# Patient Record
Sex: Male | Born: 1965
Health system: Southern US, Community
[De-identification: ages and names within clinical notes are randomized; demographics above are authoritative.]

## PROBLEM LIST (undated history)

## (undated) DIAGNOSIS — G4733 Obstructive sleep apnea (adult) (pediatric): Secondary | ICD-10-CM

## (undated) DIAGNOSIS — E119 Type 2 diabetes mellitus without complications: Secondary | ICD-10-CM

## (undated) DIAGNOSIS — Z9289 Personal history of other medical treatment: Secondary | ICD-10-CM

## (undated) DIAGNOSIS — B1920 Unspecified viral hepatitis C without hepatic coma: Secondary | ICD-10-CM

## (undated) DIAGNOSIS — I2510 Atherosclerotic heart disease of native coronary artery without angina pectoris: Secondary | ICD-10-CM

## (undated) DIAGNOSIS — I219 Acute myocardial infarction, unspecified: Secondary | ICD-10-CM

## (undated) DIAGNOSIS — E669 Obesity, unspecified: Secondary | ICD-10-CM

## (undated) HISTORY — DX: Obesity, unspecified: E66.9

## (undated) HISTORY — DX: Obstructive sleep apnea (adult) (pediatric): G47.33

## (undated) HISTORY — DX: Unspecified viral hepatitis C without hepatic coma: B19.20

## (undated) HISTORY — DX: Personal history of other medical treatment: Z92.89

## (undated) HISTORY — PX: KNEE SURGERY: SHX244

## (undated) HISTORY — DX: Atherosclerotic heart disease of native coronary artery without angina pectoris: I25.10

## (undated) HISTORY — PX: HERNIA REPAIR: SHX51

## (undated) HISTORY — PX: CERVICAL FUSION: SHX112

## (undated) HISTORY — DX: Type 2 diabetes mellitus without complications: E11.9

---

## 1999-03-04 ENCOUNTER — Emergency Department (HOSPITAL_COMMUNITY): Admission: EM | Admit: 1999-03-04 | Discharge: 1999-03-04 | Payer: Self-pay | Admitting: Emergency Medicine

## 2000-08-31 DIAGNOSIS — B1920 Unspecified viral hepatitis C without hepatic coma: Secondary | ICD-10-CM

## 2000-08-31 HISTORY — DX: Unspecified viral hepatitis C without hepatic coma: B19.20

## 2000-09-14 ENCOUNTER — Emergency Department (HOSPITAL_COMMUNITY): Admission: EM | Admit: 2000-09-14 | Discharge: 2000-09-14 | Payer: Self-pay | Admitting: Emergency Medicine

## 2006-06-09 ENCOUNTER — Encounter: Admission: RE | Admit: 2006-06-09 | Discharge: 2006-06-09 | Payer: Self-pay | Admitting: Internal Medicine

## 2007-09-09 ENCOUNTER — Encounter: Admission: RE | Admit: 2007-09-09 | Discharge: 2007-09-09 | Payer: Self-pay | Admitting: Occupational Medicine

## 2007-11-24 ENCOUNTER — Ambulatory Visit (HOSPITAL_COMMUNITY): Admission: RE | Admit: 2007-11-24 | Discharge: 2007-11-24 | Payer: Self-pay | Admitting: Orthopedic Surgery

## 2008-08-31 DIAGNOSIS — I219 Acute myocardial infarction, unspecified: Secondary | ICD-10-CM

## 2008-08-31 DIAGNOSIS — I2510 Atherosclerotic heart disease of native coronary artery without angina pectoris: Secondary | ICD-10-CM

## 2008-08-31 HISTORY — PX: CORONARY ANGIOPLASTY WITH STENT PLACEMENT: SHX49

## 2008-08-31 HISTORY — DX: Acute myocardial infarction, unspecified: I21.9

## 2008-08-31 HISTORY — DX: Atherosclerotic heart disease of native coronary artery without angina pectoris: I25.10

## 2008-09-10 ENCOUNTER — Inpatient Hospital Stay (HOSPITAL_COMMUNITY): Admission: AD | Admit: 2008-09-10 | Discharge: 2008-09-13 | Payer: Self-pay | Admitting: Cardiology

## 2008-09-11 ENCOUNTER — Encounter: Admission: RE | Admit: 2008-09-11 | Discharge: 2008-09-12 | Payer: Self-pay | Admitting: Cardiology

## 2008-10-08 ENCOUNTER — Ambulatory Visit: Payer: Self-pay | Admitting: Cardiology

## 2008-10-08 ENCOUNTER — Ambulatory Visit: Payer: Self-pay | Admitting: Cardiovascular Disease

## 2008-10-08 ENCOUNTER — Ambulatory Visit (HOSPITAL_COMMUNITY): Admission: RE | Admit: 2008-10-08 | Discharge: 2008-10-08 | Payer: Self-pay | Admitting: Cardiology

## 2008-10-11 ENCOUNTER — Encounter (HOSPITAL_COMMUNITY): Admission: RE | Admit: 2008-10-11 | Discharge: 2008-10-29 | Payer: Self-pay | Admitting: Cardiology

## 2010-01-18 IMAGING — CR DG CHEST 1V PORT
1 series · 1 of 1 positions shown · non-contrast
Comparison: 11/18/2007

CLINICAL DATA: Chest pain and

PORTABLE CHEST - 1 VIEW

[view not recorded]
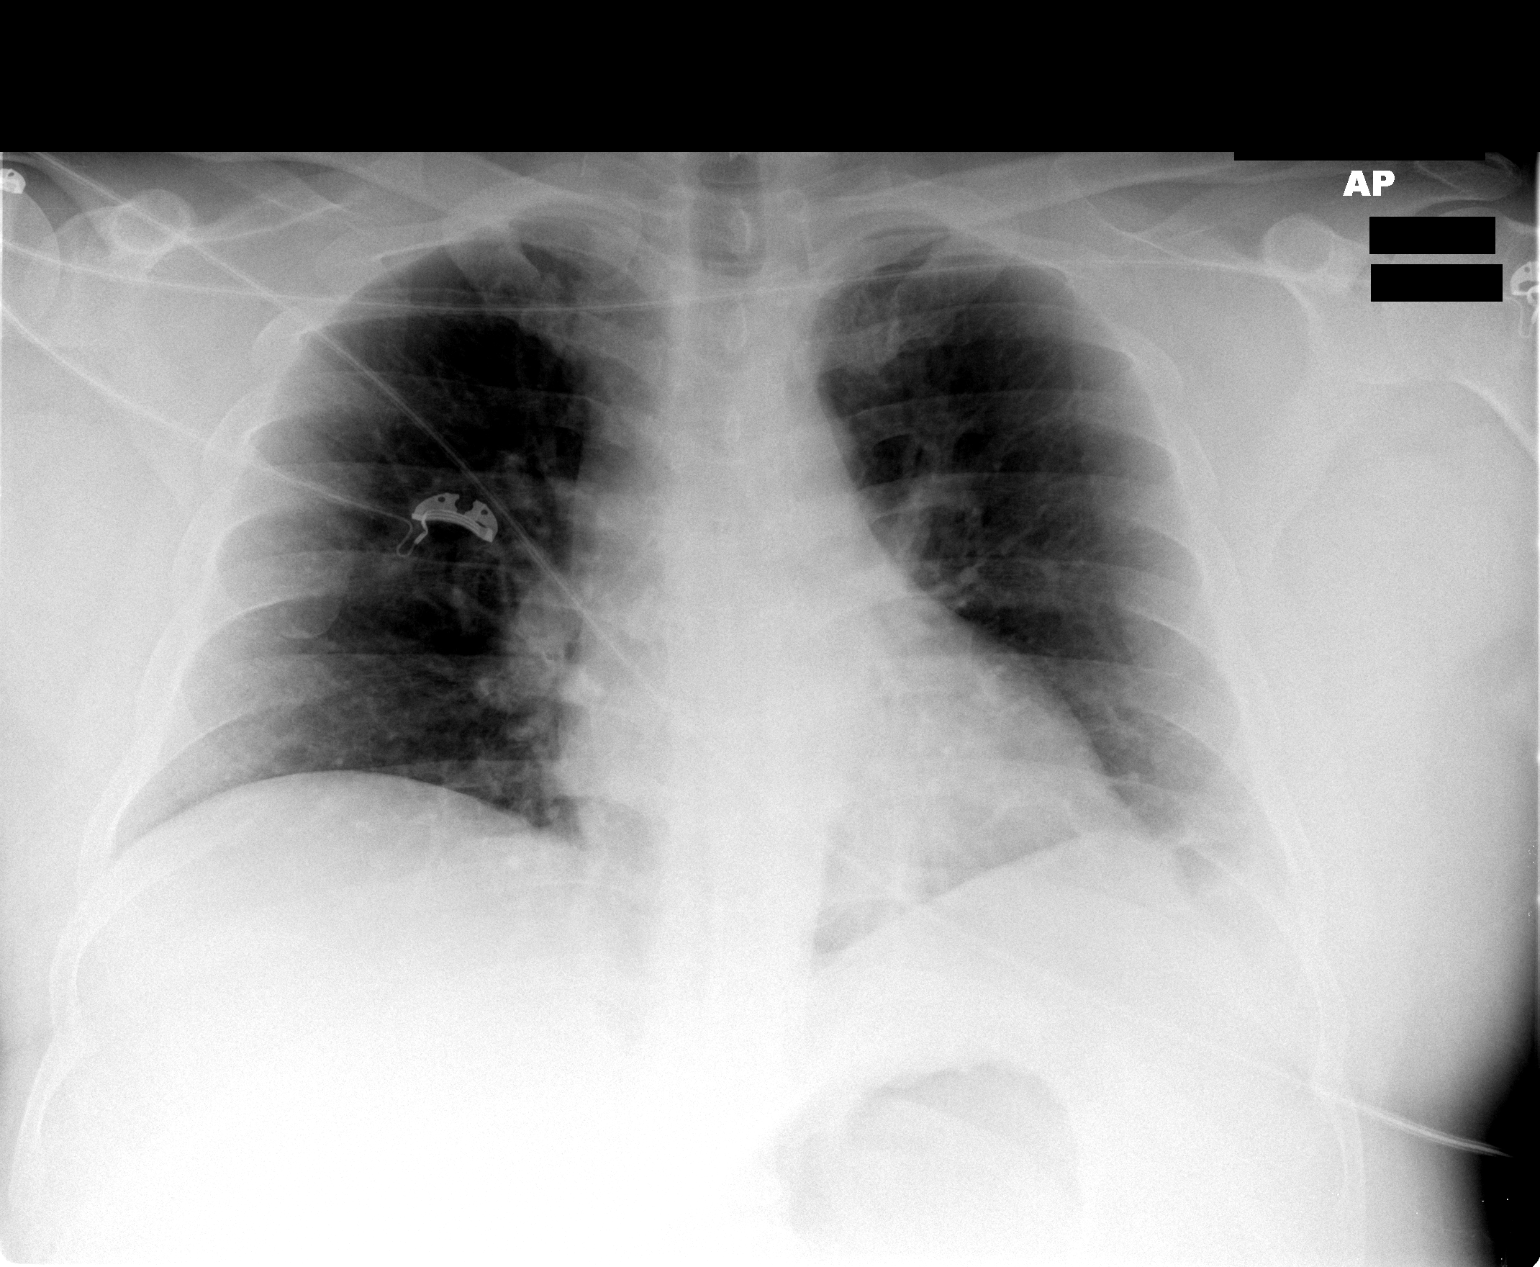

[1 of 1 positions shown; findings below may reference images not displayed]

FINDINGS: The heart size is normal. No pleural effusion is noted.

There is atelectasis at the left lung base.

No airspace densities are present.
IMPRESSION: 1.  Left base atelectasis.

## 2010-12-15 LAB — BASIC METABOLIC PANEL
BUN: 14 mg/dL (ref 6–23)
BUN: 17 mg/dL (ref 6–23)
BUN: 9 mg/dL (ref 6–23)
CO2: 24 mEq/L (ref 19–32)
CO2: 25 mEq/L (ref 19–32)
CO2: 27 mEq/L (ref 19–32)
Calcium: 8.5 mg/dL (ref 8.4–10.5)
Calcium: 9 mg/dL (ref 8.4–10.5)
Calcium: 9.2 mg/dL (ref 8.4–10.5)
Chloride: 103 mEq/L (ref 96–112)
Chloride: 105 mEq/L (ref 96–112)
Chloride: 106 mEq/L (ref 96–112)
Creatinine, Ser: 0.91 mg/dL (ref 0.4–1.5)
Creatinine, Ser: 1.03 mg/dL (ref 0.4–1.5)
Creatinine, Ser: 1.15 mg/dL (ref 0.4–1.5)
GFR calc Af Amer: >60 mL/min (ref >60)
GFR calc Af Amer: >60 mL/min (ref >60)
GFR calc Af Amer: >60 mL/min (ref >60)
GFR calc non Af Amer: >60 mL/min (ref >60)
GFR calc non Af Amer: >60 mL/min (ref >60)
GFR calc non Af Amer: >60 mL/min (ref >60)
Glucose, Bld: 117 mg/dL — ABNORMAL HIGH (ref 70–99)
Glucose, Bld: 121 mg/dL — ABNORMAL HIGH (ref 70–99)
Glucose, Bld: 130 mg/dL — ABNORMAL HIGH (ref 70–99)
Potassium: 3.7 mEq/L (ref 3.5–5.1)
Potassium: 3.8 mEq/L (ref 3.5–5.1)
Potassium: 4.1 mEq/L (ref 3.5–5.1)
Sodium: 134 mEq/L — ABNORMAL LOW (ref 135–145)
Sodium: 136 mEq/L (ref 135–145)
Sodium: 140 mEq/L (ref 135–145)

## 2010-12-15 LAB — CBC
HCT: 42.6 % (ref 39.0–52.0)
HCT: 42.8 % (ref 39.0–52.0)
HCT: 43.4 % (ref 39.0–52.0)
Hemoglobin: 14.7 g/dL (ref 13.0–17.0)
Hemoglobin: 14.8 g/dL (ref 13.0–17.0)
Hemoglobin: 14.8 g/dL (ref 13.0–17.0)
MCHC: 34 g/dL (ref 30.0–36.0)
MCHC: 34.6 g/dL (ref 30.0–36.0)
MCHC: 34.6 g/dL (ref 30.0–36.0)
MCV: 90.4 fL (ref 78.0–100.0)
MCV: 90.7 fL (ref 78.0–100.0)
MCV: 91.7 fL (ref 78.0–100.0)
Platelets: 191 10*3/uL (ref 150–400)
Platelets: 210 10*3/uL (ref 150–400)
Platelets: 215 10*3/uL (ref 150–400)
RBC: 4.71 MIL/uL (ref 4.22–5.81)
RBC: 4.72 MIL/uL (ref 4.22–5.81)
RBC: 4.73 MIL/uL (ref 4.22–5.81)
RDW: 13.3 % (ref 11.5–15.5)
RDW: 13.3 % (ref 11.5–15.5)
RDW: 13.6 % (ref 11.5–15.5)
WBC: 12.2 10*3/uL — ABNORMAL HIGH (ref 4.0–10.5)
WBC: 12.5 10*3/uL — ABNORMAL HIGH (ref 4.0–10.5)
WBC: 13 10*3/uL — ABNORMAL HIGH (ref 4.0–10.5)

## 2010-12-15 LAB — CARDIAC PANEL(CRET KIN+CKTOT+MB+TROPI)
CK, MB: 165.6 ng/mL — ABNORMAL HIGH (ref 0.3–4.0)
CK, MB: 20.5 ng/mL — ABNORMAL HIGH (ref 0.3–4.0)
CK, MB: 58.7 ng/mL — ABNORMAL HIGH (ref 0.3–4.0)
CK, MB: 80.1 ng/mL — ABNORMAL HIGH (ref 0.3–4.0)
Relative Index: 10.4 — ABNORMAL HIGH (ref 0.0–2.5)
Relative Index: 5.1 — ABNORMAL HIGH (ref 0.0–2.5)
Relative Index: 7.4 — ABNORMAL HIGH (ref 0.0–2.5)
Relative Index: 8 — ABNORMAL HIGH (ref 0.0–2.5)
Total CK: 1004 U/L — ABNORMAL HIGH (ref 7–232)
Total CK: 1596 U/L — ABNORMAL HIGH (ref 7–232)
Total CK: 401 U/L — ABNORMAL HIGH (ref 7–232)
Total CK: 794 U/L — ABNORMAL HIGH (ref 7–232)
Troponin I: 13.8 ng/mL (ref 0.00–0.06)
Troponin I: 14.92 ng/mL (ref 0.00–0.06)
Troponin I: 18.56 ng/mL (ref 0.00–0.06)
Troponin I: 37.63 ng/mL (ref 0.00–0.06)

## 2010-12-15 LAB — URINALYSIS, MICROSCOPIC ONLY
Bilirubin Urine: NEGATIVE
Glucose, UA: NEGATIVE mg/dL
Hgb urine dipstick: NEGATIVE
Ketones, ur: NEGATIVE mg/dL
Leukocytes, UA: NEGATIVE
Nitrite: NEGATIVE
Protein, ur: NEGATIVE mg/dL
Specific Gravity, Urine: 1.013 (ref 1.005–1.030)
Urobilinogen, UA: 1 mg/dL (ref 0.0–1.0)
pH: 7 (ref 5.0–8.0)

## 2010-12-15 LAB — HEPATIC FUNCTION PANEL
ALT: 51 U/L (ref 0–53)
AST: 84 U/L — ABNORMAL HIGH (ref 0–37)
Albumin: 3.3 g/dL — ABNORMAL LOW (ref 3.5–5.2)
Alkaline Phosphatase: 63 U/L (ref 39–117)
Bilirubin, Direct: 0.1 mg/dL (ref 0.0–0.3)
Indirect Bilirubin: 1 mg/dL — ABNORMAL HIGH (ref 0.3–0.9)
Total Bilirubin: 1.1 mg/dL (ref 0.3–1.2)
Total Protein: 6.5 g/dL (ref 6.0–8.3)

## 2010-12-15 LAB — LIPID PANEL
Cholesterol: 211 mg/dL — ABNORMAL HIGH (ref 0–200)
HDL: 30 mg/dL — ABNORMAL LOW (ref >39)
LDL Cholesterol: 145 mg/dL — ABNORMAL HIGH (ref 0–99)
Total CHOL/HDL Ratio: 7 RATIO
Triglycerides: 181 mg/dL — ABNORMAL HIGH (ref <150)
VLDL: 36 mg/dL (ref 0–40)

## 2010-12-15 LAB — MAGNESIUM: Magnesium: 2.4 mg/dL (ref 1.5–2.5)

## 2010-12-15 LAB — TSH: TSH: 3.15 u[IU]/mL (ref 0.350–4.500)

## 2010-12-15 LAB — PROTIME-INR
INR: 1.1 (ref 0.00–1.49)
Prothrombin Time: 14.1 seconds (ref 11.6–15.2)

## 2010-12-15 LAB — APTT: aPTT: 29 seconds (ref 24–37)

## 2011-01-13 NOTE — Discharge Summary (Signed)
[name], BEBO NO.:  [id number]   MEDICAL RECORD NO.:  [id number]          PATIENT TYPE:  INP   LOCATION:  2012                         FACILITY:  [facility]   PHYSICIAN:  Cristy Hilts. Jacinto Halim, MD       DATE OF BIRTH:  [birth year]   DATE OF ADMISSION:  09/10/2008  DATE OF DISCHARGE:  09/13/2008                               DISCHARGE SUMMARY   DISCHARGE DIAGNOSES:  1. Anterior-ST-elevation myocardial infarction this admission, treated      with left anterior descending Xience stenting on September 10, 2008.  2. Moderate left ventricular dysfunction with an ejection fraction of      40-45% at catheterization.  3. History of hepatitis C, treated with interferon and ribavirin      successfully 7 years ago.  4. Dyslipidemia, discharged on statin.  5. History of smoking.   HOSPITAL COURSE:  The patient is a pleasant 45 year old male with no  prior history of coronary artery disease.  He works as a Fish farm manager.  He developed hepatitis C as an occupational exposure  approximately 7 years ago, but was treated successfully with interferon  and ribavirin.  He presented on September 13, 2008, with sudden substernal  chest pain.  He actually tried to drive himself to the hospital, but  felt he was unable to make it and pulled into a hemodialysis center and  had them called EMS.  On arrival, he was taken emergently to the Cath  Lab for acute ST-elevation anterior MI.  In the Cath Lab, he was  enrolled in the Infuse study.  He underwent intervention by Dr. Jacinto Halim  with an LAD Xience stent placed.  There was no other significant  coronary artery disease, he did have a minor narrowing of 30% in the  RCA.  EF was 40-45%.  The patient was transferred to CCU.  He tolerated  the procedure well.  CKs peaked at about 1000.  He was transferred to  telemetry and mobilized.  The patient does have a smoking history, but  says he quit 2 weeks ago.  He was seen by Cardiac Rehab.   Apparently,  the patient has a history of sleep apnea and was placed on CPAP that he  takes at home.  Lipid panel revealed an LDL of 145 and an HDL of 30.  He  was placed on Crestor 20 mg a day.  He does have a slightly elevated  SGOT at 84 with a normal SGPT, this will need to be followed closely as  an outpatient with his history of hepatitis, but we suspect this  elevation is from his MI.  Dr. Jacinto Halim feels he can be discharged on  September 13, 2008.   DISCHARGE MEDICATIONS:  1. Coated aspirin 325 mg a day.  2. Effient 10 mg a day.  3. Crestor 20 mg a day.  4. Inspra 25 mg a day.  5. Coreg 3.125 mg twice a day.  6. Altace 5 mg daily.  7. Nitroglycerin 0.4 mg sublingual p.r.n.   DISCHARGE LABORATORY DATA:  White count 13, hemoglobin  14.8, hematocrit  42.8, and platelets 210.  Sodium 140, potassium 4.1, BUN 17, and  creatinine 1.15.  CK peaked at 1004, LDL 145, HDL 30, SGOT 84, and SGPT  51.  EKG shows a sinus rhythm with anterior ST elevation and EKG changes  consistent with post-anterior MI.  Chest x-ray shows left basilar  atelectasis.   DISPOSITION:  The patient is discharged in stable condition.  He will  follow up with Dr. Jacinto Halim as an outpatient.  He will need close  monitoring of his LFTs.  He is instructed to stay out of work until he  is cleared to return by Dr. Jacinto Halim.      Abelino Derrick, P.A.      Cristy Hilts. Jacinto Halim, MD  Electronically Signed    LKK/MEDQ  D:  09/13/2008  T:  09/14/2008  Job:  [id number]   cc:   Cristy Hilts. Jacinto Halim, MD

## 2011-01-13 NOTE — Op Note (Signed)
[name], [name]                ACCOUNT NO.:  [id number]   MEDICAL RECORD NO.:  [id number]          PATIENT TYPE:  AMB   LOCATION:  DAY                          FACILITY:  [facility]   PHYSICIAN:  Madlyn Frankel. Charlann Boxer, M.D.  DATE OF BIRTH:  30-Jul-1966   DATE OF PROCEDURE:  11/24/2007  DATE OF DISCHARGE:                               OPERATIVE REPORT   PREOPERATIVE DIAGNOSIS:  Right knee chondral injury in acute setting  from work place injury.   POSTOPERATIVE DIAGNOSIS/FINDINGS:  1. Chondral injury or at least orthopedic type defect to the medial      condyle on the distal to posterior portion, the midportion of      medial femoral condyle.  2. Degenerative nature to the posterior horn medial meniscus.  3. Synovitis.  4. A 5 to 7 mm area over the lateral apex of the patella with a      chondral defect as well.   PROCEDURE:  1. Right knee diagnostic and operative arthroscopy with medial and      patellofemoral chondroplasty with debridement of loose flaps of      cartilage back to a stable level and then microfracture carried      out.  2. Partial medial meniscectomy involving the posterior horn, most      likely is a result of the cartilage flap and defect present.  3. Synovectomy.   SURGEON:  Charlann Boxer.   ASSISTANT:  None.   ANESTHESIA:  General LMA plus locally applied pre and post procedure  anesthetic.   COMPLICATION:  None.   DRAINS:  None.   INDICATION FOR PROCEDURE:  Amen is a 45 year old male who presented to  the office for Worker's Comp evaluation of his right knee after an  injury.  An MRI had indicated chondral injury to the medial femoral  condyle.  Given the persistence of mechanical symptoms despite attempts  at conservative measures surgery was discussed and opted for.  He was  approved for surgery, consent was obtained.   PROCEDURE IN DETAIL:  The patient was prepped in the operative theater  with adequate anesthesia.  Preoperative antibiotics, Ancef,  administered.  The patient was positioned supine with the right leg  placed in a leg holder.  With the right legplaced in leg holder, the  right lower extremity was prepped and draped in a sterile fashion.  Standard inferomedial, superomedial, inferior lateral portals were  utilized.  Diagnostic evaluation of the knee revealed the above finding  and what was initially noted was a significant synovitic response  anteriorly, disrupting some of the visualization of the patellofemoral  compartment.   Medially, through the inferior medial portal, the chondral injury and  defect were noted on the medial femoral condyle distal weightbearing and  posterior weightbearing surfaces.  There was an associated degenerative  macerated central portion of the posterior horn of medial meniscus that  was penetrable via probe.  I used a biting basket and 3-5 shaver and  removed just about 5-10% of his meniscus.  The remaining meniscus did  not have any sign of instability.  The 3-5  shaver was then utilized for  significant debridement of medial femoral condyle flaps.  There was  never any areas of exposed bone.  I did have to bring the knee into a  pretty good amount of flexion to get to the posterior aspect oto remove  these unstable flaps.  The ACL was noted to be intact, the lateral  compartment looked very good.   Please note that within the medial component, at the time of the  operation, there was a fairly large free floating piece of cartilage  presenting away from the medial femoral condyle based on the size and  the appearance of the condyle.  Anteriorly, the 3-4-5 shaver was  utilized to debride the apex lesion.  Synovectomy was carried out to  help expose well the potential trochlear defect as well as debride the  medial aspect of the knee, as the patient had significant complaints of  anterior knee discomfort.  At this point though the etiology of the knee  discomfort would be a synovitic  response from his knee vs. this small  area of patellofemoral change.   Following this, the knee was re-examined to make sure there was no loose  fragments of cartilage given the debridements that were carried out.  The instrumentation was removed and the portal sites reapproximated  using a #4-0 nylon.  I then injected the knee with 40 mL of 0.25%  Marcaine with epinephrine.  Note, pre-incision I injected the portal  sites with 1% with a total of 30 mL.  The knee was then dressed into a  sterile bulky dry dressing, he was then brought to the recovery room  extubated in stable condition, tolerating the procedure well.      Madlyn Frankel Charlann Boxer, M.D.  Electronically Signed     MDO/MEDQ  D:  11/24/2007  T:  11/24/2007  Job:  [id number]

## 2011-04-22 ENCOUNTER — Other Ambulatory Visit: Payer: Self-pay | Admitting: Family Medicine

## 2011-04-22 DIAGNOSIS — R200 Anesthesia of skin: Secondary | ICD-10-CM

## 2011-04-22 DIAGNOSIS — R202 Paresthesia of skin: Secondary | ICD-10-CM

## 2011-04-22 DIAGNOSIS — R413 Other amnesia: Secondary | ICD-10-CM

## 2011-04-29 ENCOUNTER — Ambulatory Visit
Admission: RE | Admit: 2011-04-29 | Discharge: 2011-04-29 | Disposition: A | Discharge status: Home / Self Care | Payer: BC Managed Care – PPO | Source: Ambulatory Visit | Attending: Family Medicine | Admitting: Family Medicine

## 2011-04-29 DIAGNOSIS — R200 Anesthesia of skin: Secondary | ICD-10-CM

## 2011-04-29 DIAGNOSIS — R413 Other amnesia: Secondary | ICD-10-CM

## 2011-05-25 LAB — DIFFERENTIAL
Basophils Absolute: 0.2 — ABNORMAL HIGH
Basophils Relative: 2 — ABNORMAL HIGH
Eosinophils Absolute: 0.2
Eosinophils Relative: 2
Lymphocytes Relative: 29
Lymphs Abs: 2.9
Monocytes Absolute: 0.7
Monocytes Relative: 7
Neutro Abs: 6
Neutrophils Relative %: 60

## 2011-05-25 LAB — URINALYSIS, ROUTINE W REFLEX MICROSCOPIC
Bilirubin Urine: NEGATIVE
Glucose, UA: NEGATIVE
Hgb urine dipstick: NEGATIVE
Ketones, ur: NEGATIVE
Nitrite: NEGATIVE
Protein, ur: NEGATIVE
Specific Gravity, Urine: 1.021
Urobilinogen, UA: 0.2
pH: 7.5

## 2011-05-25 LAB — APTT: aPTT: 29

## 2011-05-25 LAB — BASIC METABOLIC PANEL
BUN: 9
CO2: 27
Calcium: 9.1
Chloride: 108
Creatinine, Ser: 0.99
GFR calc Af Amer: >60
GFR calc non Af Amer: >60
Glucose, Bld: 108 — ABNORMAL HIGH
Potassium: 4.3
Sodium: 141

## 2011-05-25 LAB — CBC
HCT: 44.3
Hemoglobin: 15.5
MCHC: 35
MCV: 89.8
Platelets: 224
RBC: 4.93
RDW: 13.9
WBC: 10

## 2011-05-25 LAB — PROTIME-INR
INR: 0.9
Prothrombin Time: 12.6

## 2011-07-01 ENCOUNTER — Observation Stay (HOSPITAL_COMMUNITY)
Admission: EM | Admit: 2011-07-01 | Discharge: 2011-07-02 | DRG: 143 | Disposition: A | Discharge status: Home / Self Care | Payer: BC Managed Care – PPO | Attending: Interventional Cardiology | Admitting: Interventional Cardiology

## 2011-07-01 ENCOUNTER — Emergency Department (HOSPITAL_COMMUNITY): Payer: BC Managed Care – PPO

## 2011-07-01 DIAGNOSIS — E785 Hyperlipidemia, unspecified: Secondary | ICD-10-CM | POA: Insufficient documentation

## 2011-07-01 DIAGNOSIS — I2510 Atherosclerotic heart disease of native coronary artery without angina pectoris: Secondary | ICD-10-CM | POA: Insufficient documentation

## 2011-07-01 DIAGNOSIS — I252 Old myocardial infarction: Secondary | ICD-10-CM | POA: Insufficient documentation

## 2011-07-01 DIAGNOSIS — I10 Essential (primary) hypertension: Secondary | ICD-10-CM | POA: Insufficient documentation

## 2011-07-01 DIAGNOSIS — Z9861 Coronary angioplasty status: Secondary | ICD-10-CM | POA: Insufficient documentation

## 2011-07-01 DIAGNOSIS — F17200 Nicotine dependence, unspecified, uncomplicated: Secondary | ICD-10-CM | POA: Insufficient documentation

## 2011-07-01 DIAGNOSIS — R079 Chest pain, unspecified: Principal | ICD-10-CM | POA: Insufficient documentation

## 2011-07-01 LAB — CARDIAC PANEL(CRET KIN+CKTOT+MB+TROPI)
CK, MB: 3 ng/mL (ref 0.3–4.0)
Relative Index: 1.6 (ref 0.0–2.5)
Total CK: 190 U/L (ref 7–232)
Troponin I: <0.3 ng/mL (ref <0.30)

## 2011-07-01 LAB — POCT I-STAT, CHEM 8
BUN: 18 mg/dL (ref 6–23)
Calcium, Ion: 1.2 mmol/L (ref 1.12–1.32)
Chloride: 107 mEq/L (ref 96–112)
Creatinine, Ser: 1 mg/dL (ref 0.50–1.35)
Glucose, Bld: 171 mg/dL — ABNORMAL HIGH (ref 70–99)
HCT: 43 % (ref 39.0–52.0)
Hemoglobin: 14.6 g/dL (ref 13.0–17.0)
Potassium: 4.5 mEq/L (ref 3.5–5.1)
Sodium: 141 mEq/L (ref 135–145)
TCO2: 24 mmol/L (ref 0–100)

## 2011-07-01 LAB — POCT I-STAT TROPONIN I: Troponin i, poc: 0 ng/mL (ref 0.00–0.08)

## 2011-07-01 LAB — TROPONIN I: Troponin I: <0.3 ng/mL (ref <0.30)

## 2011-07-01 LAB — CK TOTAL AND CKMB (NOT AT ARMC)
CK, MB: 3 ng/mL (ref 0.3–4.0)
Relative Index: 1.4 (ref 0.0–2.5)
Total CK: 209 U/L (ref 7–232)

## 2011-07-02 ENCOUNTER — Inpatient Hospital Stay (HOSPITAL_COMMUNITY): Payer: BC Managed Care – PPO

## 2011-07-02 LAB — CARDIAC PANEL(CRET KIN+CKTOT+MB+TROPI)
CK, MB: 3 ng/mL (ref 0.3–4.0)
Relative Index: 1.6 (ref 0.0–2.5)
Total CK: 193 U/L (ref 7–232)
Troponin I: <0.3 ng/mL (ref <0.30)

## 2011-07-02 MED ORDER — TECHNETIUM TC 99M TETROFOSMIN IV KIT
30.0000 | PACK | Freq: Once | INTRAVENOUS | Status: AC | PRN
  Administered 2011-07-02: 30 via INTRAVENOUS

## 2011-07-02 MED ORDER — TECHNETIUM TC 99M TETROFOSMIN IV KIT
10.0000 | PACK | Freq: Once | INTRAVENOUS | Status: AC | PRN
  Administered 2011-07-02: 10 via INTRAVENOUS

## 2011-07-03 NOTE — H&P (Signed)
[name], SLAPE NO.:  [id number]  MEDICAL RECORD NO.:  [id number]  LOCATION:  [facility]                         FACILITY:  [facility]  PHYSICIAN:  Corky Crafts, MDDATE OF BIRTH:  [birth year]  DATE OF ADMISSION:  07/01/2011 DATE OF DISCHARGE:                             HISTORY & PHYSICAL   PRIMARY CARDIOLOGIST:  Dr. Verdis Prime.  REASON FOR ADMISSION:  Chest discomfort.  HISTORY OF PRESENT ILLNESS:  The patient is a 45 year old man with a history of an anterior MI who was at work and suddenly developed chest discomfort radiating to his neck.  It felt just like his prior MI pain in 2010.  He has not had that pain since that time.  In 2010, he had a 3.5 x 15 Zion stent placed to his LAD.  He took Effient for 1 year and has been off of dual anti-platelet therapy.  He does take aspirin.  He has not had any problems with his heart until today.  He walks a lot at work without any problems.  He does continue to smoke.  No nausea, vomiting or diaphoresis was reported.  He did receive 3 nitroglycerins before EMS arrived, and he had some reduction in his pain.  He continues to have some low level pain.  He describes it as a 2/10.  PAST MEDICAL HISTORY: 1. Hepatitis C. 2. Coronary artery disease. 3. Anterior MI. 4. Hyperlipidemia.  PAST SURGICAL HISTORY:  Knee surgery.  Hernia surgery.  ALLERGIES:  To LATEX and SEPTRA.  MEDICATION:  At home; 1. Sublingual nitroglycerin. 2. Aspirin 325 mg daily. 3. Vitamin D. 4. Ramipril 2.5 mg daily. 5. Crestor 10 mg daily. 6. Coreg 6.25 mg b.i.d.  SOCIAL HISTORY:  He does smoke a half pack per day.  No alcohol.  FAMILY HISTORY:  Significant for coronary artery disease.  REVIEW OF SYSTEMS:  No recent fever, chills.  No swelling, no focal weakness.  No rash.  Chest discomfort as noted above.  All other systems negative.  PHYSICAL EXAMINATION:  VITAL SIGNS:  Blood pressure 120/85, heart rate is 70. GENERAL:  He is  awake, alert, in no apparent distress. HEAD:  Normocephalic, atraumatic. EYES:  Extraocular movements intact. NECK:  No JVD. CARDIOVASCULAR:  Regular rate and rhythm.  S1, S2. LUNGS:  Clear to auscultation bilaterally.  ABDOMEN:  Soft, nontender. EXTREMITIES:  No edema. NEURO:  No focal deficits. SKIN:  No rash.  LABORATORY WORK:  Creatinine 1.0, troponin of 0.  ECG shows normal sinus rhythm.  Poor R-wave progression.  No ST-segment changes, clearly different from the EKG had with his MI.  Chest x-ray shows no infiltrate or edema.  Of note, he had a brain MRI few months ago due to memory disturbance, numbness and tingling.  He had a normal noncontrast MRI of the brain.  ASSESSMENT AND PLAN:  A 45 year old with unstable angina.  We will rule out for MI with enzymes and watch on telemetry.  Start nitroglycerin and possibly heparin based on the enzyme results.  He may need a repeat catheterization.  Depending on the findings of his enzymes certainly if he rules then he will need a catheterizations.  Given  the fact that his symptoms were exactly like his prior MI, he may ultimately need a repeat angiogram.  He did mention to me that there was some talk of a mildly blocked artery in the back wall of his heart that was treated medically at the time of his last catheterization.  He will need smoking cessation, Crestor for high lipids.     Corky Crafts, MD     JSV/MEDQ  D:  07/01/2011  T:  07/01/2011  Job:  [id number]  Electronically Signed by Lance Muss MD on 07/03/2011 05:36:28 PM

## 2011-07-03 NOTE — Discharge Summary (Signed)
  NAMEMALAKIE, [name] NO.:  [id number]  MEDICAL RECORD NO.:  [id number]  LOCATION:  2038                         FACILITY:  [facility]  PHYSICIAN:  Lyn Records, M.D.   DATE OF BIRTH:  September 21, 1965  DATE OF ADMISSION:  07/01/2011 DATE OF DISCHARGE:  07/02/2011                              DISCHARGE SUMMARY   REASON FOR ADMISSION TO THE HOSPITAL:  Chest pain.  DISCHARGE DIAGNOSES: 1. Prolonged chest pain without evidence of myocardial infarction and     with a negative stress myocardial perfusion study for ischemia. 2. Coronary atherosclerotic heart disease.     a.     2010, implantation of LAD drug-eluting stent during non-ST      elevation myocardial infarction. 3. Hypertension. 4. Tobacco abuse. 5. Hyperlipidemia.  PROCEDURES PERFORMED:  Stress Cardiolite study.  DISCHARGE INSTRUCTIONS: 1. The patient will follow up with Dr. Verdis Prime.  Dr. Michaelle Copas     office will call with an appointment. 2. Medications:  Aspirin 325 mg per day, carvedilol 6.25 mg twice per     day, Crestor 10 mg daily, nitroglycerin 0.4 mg, if recurrent chest     pain, may use 1 tablet every 5 minutes up to a total of 3.  He is     instructed to return to the hospital if any recurrent chest pain. 3. Ramipril 2.5 mg daily. 4. Vitamin D3 OTC one tablet by mouth daily.  Activities:  Unrestricted.  Return to work:  July 03, 2011.  Condition on discharge:  Improved.  History, physical, and hospital course:  Please see the admitting history and physical.  IV nitroglycerin was started in the emergency room, but the patient's chest discomfort had abated before the medication was started.  Three sets of cardiac markers were negative.  EKGs were unremarkable.  A stress nuclear study did not reveal evidence of ischemia and LVEF was greater than 50%.  The patient was counseled to discontinue cigarette smoking.     Lyn Records, M.D.     HWS/MEDQ  D:  07/02/2011  T:   07/03/2011  Job:  [id number]  Electronically Signed by Verdis Prime M.D. on 07/03/2011 10:12:39 AM

## 2012-10-29 DIAGNOSIS — E119 Type 2 diabetes mellitus without complications: Secondary | ICD-10-CM

## 2012-10-29 HISTORY — DX: Type 2 diabetes mellitus without complications: E11.9

## 2012-12-23 ENCOUNTER — Other Ambulatory Visit: Payer: Self-pay | Admitting: Neurosurgery

## 2012-12-23 DIAGNOSIS — M546 Pain in thoracic spine: Secondary | ICD-10-CM

## 2012-12-27 ENCOUNTER — Ambulatory Visit
Admission: RE | Admit: 2012-12-27 | Discharge: 2012-12-27 | Disposition: A | Discharge status: Home / Self Care | Payer: BC Managed Care – PPO | Source: Ambulatory Visit | Attending: Neurosurgery | Admitting: Neurosurgery

## 2012-12-27 DIAGNOSIS — M546 Pain in thoracic spine: Secondary | ICD-10-CM

## 2013-06-13 ENCOUNTER — Other Ambulatory Visit: Payer: Self-pay

## 2013-06-13 DIAGNOSIS — I2510 Atherosclerotic heart disease of native coronary artery without angina pectoris: Secondary | ICD-10-CM | POA: Insufficient documentation

## 2013-06-13 DIAGNOSIS — E669 Obesity, unspecified: Secondary | ICD-10-CM

## 2013-06-13 DIAGNOSIS — R0989 Other specified symptoms and signs involving the circulatory and respiratory systems: Secondary | ICD-10-CM

## 2013-06-13 DIAGNOSIS — G4730 Sleep apnea, unspecified: Secondary | ICD-10-CM | POA: Insufficient documentation

## 2013-06-13 DIAGNOSIS — E119 Type 2 diabetes mellitus without complications: Secondary | ICD-10-CM

## 2013-06-13 DIAGNOSIS — R931 Abnormal findings on diagnostic imaging of heart and coronary circulation: Secondary | ICD-10-CM

## 2013-06-13 DIAGNOSIS — I10 Essential (primary) hypertension: Secondary | ICD-10-CM | POA: Insufficient documentation

## 2013-06-13 DIAGNOSIS — B1920 Unspecified viral hepatitis C without hepatic coma: Secondary | ICD-10-CM | POA: Insufficient documentation

## 2013-06-13 DIAGNOSIS — I5030 Unspecified diastolic (congestive) heart failure: Secondary | ICD-10-CM | POA: Insufficient documentation

## 2013-06-13 DIAGNOSIS — I5020 Unspecified systolic (congestive) heart failure: Secondary | ICD-10-CM

## 2013-06-13 MED ORDER — ROSUVASTATIN CALCIUM 10 MG PO TABS: 10.0000 mg | ORAL_TABLET | Freq: Every day | ORAL | Status: DC

## 2013-08-12 ENCOUNTER — Encounter: Payer: Self-pay | Admitting: Cardiology

## 2013-08-14 ENCOUNTER — Encounter: Payer: Self-pay | Admitting: General Surgery

## 2013-08-14 ENCOUNTER — Ambulatory Visit (INDEPENDENT_AMBULATORY_CARE_PROVIDER_SITE_OTHER): Payer: BC Managed Care – PPO | Admitting: Cardiology

## 2013-08-14 VITALS — BP 122/79 | HR 79 | Ht 66.0 in | Wt 248.0 lb

## 2013-08-14 DIAGNOSIS — E669 Obesity, unspecified: Secondary | ICD-10-CM

## 2013-08-14 DIAGNOSIS — G4733 Obstructive sleep apnea (adult) (pediatric): Secondary | ICD-10-CM

## 2013-08-14 NOTE — Progress Notes (Signed)
[address] [place], [place]  [address] Phone: [phone] Fax:  [phone]  Date:  08/14/2013    ID:  [name], DOB [birth year], MRN [id number]  PCP:  Allean Found, MD  Sleep Medicine:  Armanda Magic, MD   History of Present Illness: [name] is a 47 y.o. male with a history of obesity and severe OSA on CPAP.  He is doing well.  He is tolerating his CPAP device well.  He tolerates his mask well which is a nasal pillow mask.  He feels the pressure is adequate.  He feels rested when he gets up in the am.  He has no daytime sleepiness.  He rides his bike occasionally.   Wt Readings from Last 3 Encounters:  08/14/13 248 lb (112.492 kg)  07/02/11 279 lb 15.8 oz (127 kg)     Past Medical History  Diagnosis Date  . Obstructive sleep apnea   . Hepatitis C 2002    with treatment  . Diabetes 10/2012    [facility]  . History of cardiovascular stress test 10/2008 & 10/2010    Normal repeat normal  . Coronary artery disease 08/2008    AWMI with DES Xince EF 40%  . Obesity (BMI 30-39.9)     Current Outpatient Prescriptions  Medication Sig Dispense Refill  . aspirin 325 MG tablet Take 325 mg by mouth at bedtime.        . carvedilol (COREG) 6.25 MG tablet Take 6.25 mg by mouth 2 (two) times daily with a meal.        . nitroGLYCERIN (NITROSTAT) 0.4 MG SL tablet Place 0.4 mg under the tongue every 5 (five) minutes as needed. For chest pain       . ramipril (ALTACE) 2.5 MG tablet Take 2.5 mg by mouth at bedtime.        . rosuvastatin (CRESTOR) 10 MG tablet Take 1 tablet (10 mg total) by mouth at bedtime.  30 tablet  5  . VITAMIN D, CHOLECALCIFEROL, PO Take 1 tablet by mouth daily.         No current facility-administered medications for this visit.    Allergies:    Allergies  Allergen Reactions  . Latex Shortness Of Breath, Itching and Rash  . Sulfa Antibiotics Other (See Comments)    unknown    Social History:  The patient  reports that he quit smoking  about 2 months ago. His smoking use included Cigarettes. He smoked 0.00 packs per day for 20 years. He does not have any smokeless tobacco history on file. He reports that he does not drink alcohol or use illicit drugs.   Family History:  The patient's family history includes Heart disease in his father.   ROS:  Please see the history of present illness.      All other systems reviewed and negative.   PHYSICAL EXAM: VS:  BP 122/79  Pulse 79  Ht 5\' 6"  (1.676 m)  Wt 248 lb (112.492 kg)  BMI 40.05 kg/m2 Well nourished, well developed, in no acute distress HEENT: normal Neck: no JVD Cardiac:  normal S1, S2; RRR; no murmur Lungs:  clear to auscultation bilaterally, no wheezing, rhonchi or rales Abd: soft, nontender, no hepatomegaly Ext: no edema Skin: warm and dry Neuro:  CNs 2-12 intact, no focal abnormalities noted       ASSESSMENT AND PLAN:  1. OSA on CPAP therapy - his download showed an AHI 0.4/hr on 12cm H2O and 98%  compliant in using more than 4 hours nightly. 2. Obesity 3. HTN - well controlled  - continue carvedilol/ramipril  Followup with me in 6 months  Signed, Armanda Magic, MD 08/14/2013 4:39 PM

## 2013-08-14 NOTE — Patient Instructions (Signed)
Your physician recommends that you continue on your current medications as directed. Please refer to the Current Medication list given to you today.  Your physician wants you to follow-up in: 6 months with Dr Turner You will receive a reminder letter in the mail two months in advance. If you don't receive a letter, please call our office to schedule the follow-up appointment.  

## 2013-12-11 ENCOUNTER — Ambulatory Visit (INDEPENDENT_AMBULATORY_CARE_PROVIDER_SITE_OTHER): Payer: BC Managed Care – PPO | Admitting: Interventional Cardiology

## 2013-12-11 ENCOUNTER — Encounter: Payer: Self-pay | Admitting: Interventional Cardiology

## 2013-12-11 VITALS — BP 100/70 | HR 71 | Ht 66.0 in | Wt 249.0 lb

## 2013-12-11 DIAGNOSIS — G4730 Sleep apnea, unspecified: Secondary | ICD-10-CM

## 2013-12-11 DIAGNOSIS — E785 Hyperlipidemia, unspecified: Secondary | ICD-10-CM | POA: Insufficient documentation

## 2013-12-11 DIAGNOSIS — R931 Abnormal findings on diagnostic imaging of heart and coronary circulation: Secondary | ICD-10-CM

## 2013-12-11 DIAGNOSIS — I10 Essential (primary) hypertension: Secondary | ICD-10-CM

## 2013-12-11 DIAGNOSIS — I2510 Atherosclerotic heart disease of native coronary artery without angina pectoris: Secondary | ICD-10-CM

## 2013-12-11 DIAGNOSIS — R0989 Other specified symptoms and signs involving the circulatory and respiratory systems: Secondary | ICD-10-CM

## 2013-12-11 NOTE — Progress Notes (Signed)
Patient ID: [name], male   DOB: 01/11/1966, 48 y.o.   MRN: [id number]    [address] [place], [place]  [address] Phone: [phone] Fax:  [phone]  Date:  12/11/2013   ID:  Ricky Holmeseter B Zollars, DOB 12/09/1965, MRN [id number]  PCP:  Allean FoundSMITH,CANDACE THIELE, MD   ASSESSMENT:  1. Coronary atherosclerosis, stable without angina or anginal equivalent 2. Hyperlipidemia on therapy and managed by primary care 3. Diabetes mellitus, apparently not as well controlled recently as would be desired  4. Known mild systolic dysfunction without heart failure complaints 5 .Essential hypertension   PLAN:  1. aerobic exercise to facilitate weight loss and improve risk factor modification 2. Clinical followup in one year 3. Work reduction to help facilitate weight 1   SUBJECTIVE: [name] is a 48 y.o. male is asymptomatic from the cardiac standpoint. He has not had chest discomfort or syncope. He denies palpitations. No peripheral edema. Overall, the patient is doing well.   Wt Readings from Last 3 Encounters:  12/11/13 249 lb (112.946 kg)  08/14/13 248 lb (112.492 kg)  07/02/11 279 lb 15.8 oz (127 kg)     Past Medical History  Diagnosis Date  . Obstructive sleep apnea   . Hepatitis C 2002    with treatment  . Diabetes 10/2012    [facility]  . History of cardiovascular stress test 10/2008 & 10/2010    Normal repeat normal  . Coronary artery disease 08/2008    AWMI with DES Xince EF 40%  . Obesity (BMI 30-39.9)     Current Outpatient Prescriptions  Medication Sig Dispense Refill  . aspirin 325 MG tablet Take 325 mg by mouth at bedtime.        . carvedilol (COREG) 6.25 MG tablet Take 6.25 mg by mouth 2 (two) times daily with a meal.        . nitroGLYCERIN (NITROSTAT) 0.4 MG SL tablet Place 0.4 mg under the tongue every 5 (five) minutes as needed. For chest pain       . ramipril (ALTACE) 2.5 MG tablet Take 2.5 mg by mouth at bedtime.        . rosuvastatin (CRESTOR) 10 MG  tablet Take 1 tablet (10 mg total) by mouth at bedtime.  30 tablet  5  . VITAMIN D, CHOLECALCIFEROL, PO Take 1 tablet by mouth daily.         No current facility-administered medications for this visit.    Allergies:    Allergies  Allergen Reactions  . Latex Shortness Of Breath, Itching and Rash  . Sulfa Antibiotics Other (See Comments)    unknown    Social History:  The patient  reports that he quit smoking about 6 months ago. His smoking use included Cigarettes. He smoked 0.00 packs per day for 20 years. He does not have any smokeless tobacco history on file. He reports that he does not drink alcohol or use illicit drugs.   ROS:  Please see the history of present illness.   Has not had neurological complaints or claudication   All other systems reviewed and negative.   OBJECTIVE: VS:  BP 100/70  Pulse 71  Ht 5\' 6"  (1.676 m)  Wt 249 lb (112.946 kg)  BMI 40.21 kg/m2 Well nourished, well developed, in no acute distress, obese HEENT: normal Neck: JVD flat. Carotid bruit absent  Cardiac:  normal S1, S2; RRR; no murmur Lungs:  clear to auscultation bilaterally, no wheezing, rhonchi or rales  Abd: soft, nontender, no hepatomegaly Ext: Edema absent. Pulses 2+ and symmetric Skin: warm and dry Neuro:  CNs 2-12 intact, no focal abnormalities noted  EKG:  Normal sinus rhythm with nonspecific T-wave abnormality and basically otherwise normal       Signed, Darci NeedleHenry W. B. Sekou Zuckerman III, MD 12/11/2013 3:53 PM

## 2013-12-11 NOTE — Patient Instructions (Signed)
Your physician recommends that you continue on your current medications as directed. Please refer to the Current Medication list given to you today.  Your physician discussed the importance of regular exercise (cardio)and recommended that you start or continue a regular exercise program for good health.   Your physician wants you to follow-up in: 1 year with Dr. Katrinka BlazingSmith. You will receive a reminder letter in the mail two months in advance. If you don't receive a letter, please call our office to schedule the follow-up appointment.

## 2014-04-09 ENCOUNTER — Ambulatory Visit: Payer: BC Managed Care – PPO | Admitting: Physician Assistant

## 2014-04-09 ENCOUNTER — Emergency Department (HOSPITAL_BASED_OUTPATIENT_CLINIC_OR_DEPARTMENT_OTHER): Payer: BC Managed Care – PPO

## 2014-04-09 ENCOUNTER — Inpatient Hospital Stay (HOSPITAL_BASED_OUTPATIENT_CLINIC_OR_DEPARTMENT_OTHER)
Admission: EM | Admit: 2014-04-09 | Discharge: 2014-04-10 | DRG: 287 | Disposition: A | Discharge status: Home / Self Care | Payer: BC Managed Care – PPO | Attending: Interventional Cardiology | Admitting: Interventional Cardiology

## 2014-04-09 ENCOUNTER — Encounter (HOSPITAL_BASED_OUTPATIENT_CLINIC_OR_DEPARTMENT_OTHER): Payer: Self-pay | Admitting: Emergency Medicine

## 2014-04-09 DIAGNOSIS — E785 Hyperlipidemia, unspecified: Secondary | ICD-10-CM | POA: Diagnosis present

## 2014-04-09 DIAGNOSIS — Z79899 Other long term (current) drug therapy: Secondary | ICD-10-CM

## 2014-04-09 DIAGNOSIS — I10 Essential (primary) hypertension: Secondary | ICD-10-CM | POA: Diagnosis present

## 2014-04-09 DIAGNOSIS — I2510 Atherosclerotic heart disease of native coronary artery without angina pectoris: Principal | ICD-10-CM

## 2014-04-09 DIAGNOSIS — Z981 Arthrodesis status: Secondary | ICD-10-CM | POA: Diagnosis not present

## 2014-04-09 DIAGNOSIS — Z9861 Coronary angioplasty status: Secondary | ICD-10-CM

## 2014-04-09 DIAGNOSIS — Z882 Allergy status to sulfonamides status: Secondary | ICD-10-CM | POA: Diagnosis not present

## 2014-04-09 DIAGNOSIS — E669 Obesity, unspecified: Secondary | ICD-10-CM

## 2014-04-09 DIAGNOSIS — Z8674 Personal history of sudden cardiac arrest: Secondary | ICD-10-CM

## 2014-04-09 DIAGNOSIS — Z87891 Personal history of nicotine dependence: Secondary | ICD-10-CM | POA: Diagnosis not present

## 2014-04-09 DIAGNOSIS — R079 Chest pain, unspecified: Secondary | ICD-10-CM | POA: Diagnosis present

## 2014-04-09 DIAGNOSIS — I200 Unstable angina: Secondary | ICD-10-CM | POA: Diagnosis present

## 2014-04-09 DIAGNOSIS — Z6841 Body Mass Index (BMI) 40.0 and over, adult: Secondary | ICD-10-CM | POA: Diagnosis not present

## 2014-04-09 DIAGNOSIS — B1920 Unspecified viral hepatitis C without hepatic coma: Secondary | ICD-10-CM

## 2014-04-09 DIAGNOSIS — Z7982 Long term (current) use of aspirin: Secondary | ICD-10-CM

## 2014-04-09 DIAGNOSIS — I252 Old myocardial infarction: Secondary | ICD-10-CM

## 2014-04-09 DIAGNOSIS — G4730 Sleep apnea, unspecified: Secondary | ICD-10-CM | POA: Diagnosis present

## 2014-04-09 DIAGNOSIS — E119 Type 2 diabetes mellitus without complications: Secondary | ICD-10-CM | POA: Diagnosis present

## 2014-04-09 DIAGNOSIS — G4733 Obstructive sleep apnea (adult) (pediatric): Secondary | ICD-10-CM | POA: Diagnosis present

## 2014-04-09 DIAGNOSIS — Z8249 Family history of ischemic heart disease and other diseases of the circulatory system: Secondary | ICD-10-CM | POA: Diagnosis not present

## 2014-04-09 DIAGNOSIS — Z91040 Latex allergy status: Secondary | ICD-10-CM

## 2014-04-09 HISTORY — DX: Acute myocardial infarction, unspecified: I21.9

## 2014-04-09 LAB — CREATININE, SERUM
Creatinine, Ser: 0.88 mg/dL (ref 0.50–1.35)
GFR calc Af Amer: >90 mL/min (ref >90)
GFR calc non Af Amer: >90 mL/min (ref >90)

## 2014-04-09 LAB — COMPREHENSIVE METABOLIC PANEL
ALT: 22 U/L (ref 0–53)
AST: 18 U/L (ref 0–37)
Albumin: 3.9 g/dL (ref 3.5–5.2)
Alkaline Phosphatase: 73 U/L (ref 39–117)
Anion gap: 12 (ref 5–15)
BUN: 16 mg/dL (ref 6–23)
CO2: 26 mEq/L (ref 19–32)
Calcium: 9.9 mg/dL (ref 8.4–10.5)
Chloride: 103 mEq/L (ref 96–112)
Creatinine, Ser: 0.9 mg/dL (ref 0.50–1.35)
GFR calc Af Amer: >90 mL/min (ref >90)
GFR calc non Af Amer: >90 mL/min (ref >90)
Glucose, Bld: 245 mg/dL — ABNORMAL HIGH (ref 70–99)
Potassium: 4.4 mEq/L (ref 3.7–5.3)
Sodium: 141 mEq/L (ref 137–147)
Total Bilirubin: 0.3 mg/dL (ref 0.3–1.2)
Total Protein: 7.3 g/dL (ref 6.0–8.3)

## 2014-04-09 LAB — CBC WITH DIFFERENTIAL/PLATELET
Basophils Absolute: 0 10*3/uL (ref 0.0–0.1)
Basophils Relative: 0 % (ref 0–1)
Eosinophils Absolute: 0.2 10*3/uL (ref 0.0–0.7)
Eosinophils Relative: 2 % (ref 0–5)
HCT: 42.6 % (ref 39.0–52.0)
Hemoglobin: 14.4 g/dL (ref 13.0–17.0)
Lymphocytes Relative: 27 % (ref 12–46)
Lymphs Abs: 2.3 10*3/uL (ref 0.7–4.0)
MCH: 30.7 pg (ref 26.0–34.0)
MCHC: 33.8 g/dL (ref 30.0–36.0)
MCV: 90.8 fL (ref 78.0–100.0)
Monocytes Absolute: 0.5 10*3/uL (ref 0.1–1.0)
Monocytes Relative: 5 % (ref 3–12)
Neutro Abs: 5.7 10*3/uL (ref 1.7–7.7)
Neutrophils Relative %: 66 % (ref 43–77)
Platelets: 203 10*3/uL (ref 150–400)
RBC: 4.69 MIL/uL (ref 4.22–5.81)
RDW: 13.8 % (ref 11.5–15.5)
WBC: 8.6 10*3/uL (ref 4.0–10.5)

## 2014-04-09 LAB — CBC
HCT: 40.5 % (ref 39.0–52.0)
Hemoglobin: 13.7 g/dL (ref 13.0–17.0)
MCH: 30.6 pg (ref 26.0–34.0)
MCHC: 33.8 g/dL (ref 30.0–36.0)
MCV: 90.6 fL (ref 78.0–100.0)
Platelets: 186 10*3/uL (ref 150–400)
RBC: 4.47 MIL/uL (ref 4.22–5.81)
RDW: 13.8 % (ref 11.5–15.5)
WBC: 10.1 10*3/uL (ref 4.0–10.5)

## 2014-04-09 LAB — PRO B NATRIURETIC PEPTIDE: Pro B Natriuretic peptide (BNP): 13.7 pg/mL (ref 0–125)

## 2014-04-09 LAB — GLUCOSE, CAPILLARY
Glucose-Capillary: 76 mg/dL (ref 70–99)
Glucose-Capillary: 102 mg/dL — ABNORMAL HIGH (ref 70–99)

## 2014-04-09 LAB — HEPARIN LEVEL (UNFRACTIONATED): Heparin Unfractionated: 0.28 IU/mL — ABNORMAL LOW (ref 0.30–0.70)

## 2014-04-09 LAB — APTT: aPTT: 32 seconds (ref 24–37)

## 2014-04-09 LAB — PROTIME-INR
INR: 0.94 (ref 0.00–1.49)
Prothrombin Time: 12.6 seconds (ref 11.6–15.2)

## 2014-04-09 LAB — TROPONIN I
Troponin I: <0.3 ng/mL (ref <0.30)
Troponin I: <0.3 ng/mL (ref <0.30)

## 2014-04-09 MED ORDER — RAMIPRIL 2.5 MG PO CAPS
2.5000 mg | ORAL_CAPSULE | Freq: Every day | ORAL | Status: DC
  Administered 2014-04-09: 2.5 mg via ORAL
  Filled 2014-04-09 (×2): qty 1

## 2014-04-09 MED ORDER — HEPARIN (PORCINE) IN NACL 100-0.45 UNIT/ML-% IJ SOLN
1400.0000 [IU]/h | INTRAMUSCULAR | Status: DC
  Administered 2014-04-10: 1400 [IU]/h via INTRAVENOUS
  Filled 2014-04-09 (×3): qty 250

## 2014-04-09 MED ORDER — ONDANSETRON HCL 4 MG/2ML IJ SOLN
4.0000 mg | Freq: Four times a day (QID) | INTRAMUSCULAR | Status: DC | PRN
Start: 2014-04-09 — End: 2014-04-10

## 2014-04-09 MED ORDER — ALPRAZOLAM 0.25 MG PO TABS
0.2500 mg | ORAL_TABLET | Freq: Two times a day (BID) | ORAL | Status: DC | PRN
Start: 2014-04-09 — End: 2014-04-10

## 2014-04-09 MED ORDER — NITROGLYCERIN 0.4 MG SL SUBL
0.4000 mg | SUBLINGUAL_TABLET | SUBLINGUAL | Status: DC | PRN
Start: 2014-04-09 — End: 2014-04-10

## 2014-04-09 MED ORDER — ASPIRIN 325 MG PO TABS
325.0000 mg | ORAL_TABLET | Freq: Every day | ORAL | Status: DC
  Filled 2014-04-09: qty 1

## 2014-04-09 MED ORDER — INSULIN ASPART 100 UNIT/ML ~~LOC~~ SOLN: 0.0000 [IU] | Freq: Every day | SUBCUTANEOUS | Status: DC

## 2014-04-09 MED ORDER — CARVEDILOL 6.25 MG PO TABS
6.2500 mg | ORAL_TABLET | Freq: Two times a day (BID) | ORAL | Status: DC
  Administered 2014-04-09 – 2014-04-10 (×3): 6.25 mg via ORAL
  Filled 2014-04-09 (×4): qty 1

## 2014-04-09 MED ORDER — ASPIRIN EC 81 MG PO TBEC: 81.0000 mg | DELAYED_RELEASE_TABLET | Freq: Every day | ORAL | Status: DC

## 2014-04-09 MED ORDER — HEPARIN BOLUS VIA INFUSION
4000.0000 [IU] | Freq: Once | INTRAVENOUS | Status: AC
  Administered 2014-04-09: 4000 [IU] via INTRAVENOUS

## 2014-04-09 MED ORDER — SODIUM CHLORIDE 0.9 % IV SOLN
1.0000 mL/kg/h | INTRAVENOUS | Status: DC
  Administered 2014-04-09 – 2014-04-10 (×2): 1 mL/kg/h via INTRAVENOUS

## 2014-04-09 MED ORDER — ZOLPIDEM TARTRATE 5 MG PO TABS: 5.0000 mg | ORAL_TABLET | Freq: Every evening | ORAL | Status: DC | PRN

## 2014-04-09 MED ORDER — NITROGLYCERIN 2 % TD OINT
0.5000 [in_us] | TOPICAL_OINTMENT | Freq: Four times a day (QID) | TRANSDERMAL | Status: DC
  Administered 2014-04-09 – 2014-04-10 (×3): 0.5 [in_us] via TOPICAL
  Filled 2014-04-09: qty 30

## 2014-04-09 MED ORDER — ASPIRIN 81 MG PO CHEW
81.0000 mg | CHEWABLE_TABLET | ORAL | Status: AC
  Administered 2014-04-10: 81 mg via ORAL
  Filled 2014-04-09: qty 1

## 2014-04-09 MED ORDER — INSULIN ASPART 100 UNIT/ML ~~LOC~~ SOLN: 0.0000 [IU] | Freq: Three times a day (TID) | SUBCUTANEOUS | Status: DC

## 2014-04-09 MED ORDER — HEPARIN (PORCINE) IN NACL 100-0.45 UNIT/ML-% IJ SOLN
1200.0000 [IU]/h | Freq: Once | INTRAMUSCULAR | Status: AC
  Administered 2014-04-09: 1200 [IU]/h via INTRAVENOUS
  Filled 2014-04-09: qty 250

## 2014-04-09 MED ORDER — ATORVASTATIN CALCIUM 20 MG PO TABS
20.0000 mg | ORAL_TABLET | Freq: Every day | ORAL | Status: DC
  Administered 2014-04-09 – 2014-04-10 (×2): 20 mg via ORAL
  Filled 2014-04-09 (×2): qty 1

## 2014-04-09 MED ORDER — HEPARIN SODIUM (PORCINE) 5000 UNIT/ML IJ SOLN
4000.0000 [IU] | Freq: Once | INTRAMUSCULAR | Status: DC
  Filled 2014-04-09: qty 1

## 2014-04-09 MED ORDER — ACETAMINOPHEN 325 MG PO TABS: 650.0000 mg | ORAL_TABLET | ORAL | Status: DC | PRN

## 2014-04-09 NOTE — Progress Notes (Signed)
ANTICOAGULATION CONSULT NOTE - Initial Consult  Pharmacy Consult for heparin Indication: chest pain/ACS  Allergies  Allergen Reactions  . Latex Shortness Of Breath, Itching and Rash  . Sulfa Antibiotics Other (See Comments)    unknown    Patient Measurements: Height: 5\' 6"  (167.6 cm) Weight: 247 lb (112.038 kg) IBW/kg (Calculated) : 63.8 Heparin Dosing Weight: 89.6kg  Vital Signs: Temp: 98.7 F (37.1 C) (08/10 1351) Temp src: Oral (08/10 1351) BP: 91/46 mmHg (08/10 1603) Pulse Rate: 72 (08/10 1603)  Labs:  Recent Labs  04/09/14 1420  HGB 14.4  HCT 42.6  PLT 203  CREATININE 0.90  TROPONINI <0.30    Estimated Creatinine Clearance: 116.7 ml/min (by C-G formula based on Cr of 0.9).   Medical History: Past Medical History  Diagnosis Date  . Obstructive sleep apnea   . Hepatitis C 2002    with treatment  . Diabetes 10/2012    [facility]  . History of cardiovascular stress test 10/2008 & 10/2010    Normal repeat normal  . Coronary artery disease 08/2008    AWMI with DES Xince EF 40%  . Obesity (BMI 30-39.9)   . Cardiac arrest   . AMI (acute myocardial infarction)      Assessment: 48 yo male with CP and noted with history of MI (stent in 2010). [facility] Pharmacy has been consulted to begin heparin. Hg/hct= 14.4/42.6, pltc= 203.  Goal of Therapy:  Heparin level 0.3-0.7 units/ml Monitor platelets by anticoagulation protocol: Yes   Plan:  -Heparin bolus 4000 units IV followed by 1250 units/hr (~14 units/kg/hr) -Heparin level in 6 hours and daily wth CBC daily  Harland Germanndrew Jax Kentner, Pharm D 04/09/2014 4:38 PM

## 2014-04-09 NOTE — ED Notes (Signed)
Pt having intermittent chest pain for two weeks.  Some sob, lightheadedness, some issues with memory.  Pt having extreme tiredness.

## 2014-04-09 NOTE — H&P (Signed)
Patient ID: [name] MRN: [id number], DOB/AGE: 1966/03/12   Admit date: 04/09/2014   Primary Physician: Allean Found, MD Primary Cardiologist: Dr Mendel Ryder  HPI:  The patient is a pleasant 48 year old male followed by Dr Katrinka Blazing. He works as a Camera operator. He developed hepatitis C as an occupational exposure  In 2002, but was treated successfully with interferon  and ribavirin. He presented on September 13, 2008, with an acute ST-elevation anterior MI. He underwent intervention with an LAD Xience stent. There was no other significant coronary artery disease except for minor narrowing of 30% in the RCA. His EF was 40-45% then. He was admitted again in 2012 and had a low risk Myoview, EF in 2012 59% at [facility]. He has been having SSCP x 3 weeks. He says it started off as pain in his teeth "just like before". He actually went to his dentist and was told his teeth were OK. He then started having SSCP with radiation to his Lt shoulder and Lt arm. He says its worse with exertion, better with NTG. He was supposed to see Dr Katrinka Blazing at the office today but had car trouble. When he couldn't reschedule he went to the Med center [facility] and was transferred here. He is currently pain free.    Problem List: Past Medical History  Diagnosis Date  . Obstructive sleep apnea   . Hepatitis C 2002    with treatment  . Diabetes 10/2012    [facility]  . History of cardiovascular stress test 10/2008 & 10/2010    Normal repeat normal EF 59% 2012  . Coronary artery disease 08/2008    AWMI with DES Xince EF 40%  . Obesity (BMI 30-39.9)   . AMI (acute myocardial infarction) 2010    Past Surgical History  Procedure Laterality Date  . Knee surgery Right   . Coronary angioplasty with stent placement  2010  . Hernia repair Right   . Cervical fusion       Allergies:  Allergies  Allergen Reactions  . Latex Shortness Of Breath, Itching and Rash  . Sulfa Antibiotics Other (See Comments)   unknown     Home Medications Current Facility-Administered Medications  Medication Dose Route Frequency Provider Last Rate Last Dose  . heparin ADULT infusion 100 units/mL (25000 units/250 mL)  1,250 Units/hr Intravenous Continuous Benny Lennert, [facility]         Family History  Problem Relation Age of Onset  . Heart disease Father      History   Social History  . Marital Status: Married    Spouse Name: N/A    Number of Children: N/A  . Years of Education: N/A   Occupational History  . Not on file.   Social History Main Topics  . Smoking status: Former Smoker -- 20 years    Types: Cigarettes    Quit date: 05/20/2013  . Smokeless tobacco: Not on file  . Alcohol Use: No  . Drug Use: No  . Sexual Activity: Not on file   Other Topics Concern  . Not on file   Social History Narrative  . No narrative on file     Review of Systems: General: negative for chills, fever, night sweats or weight changes.  Cardiovascular: negative for chest pain, dyspnea on exertion, edema, orthopnea, palpitations, paroxysmal nocturnal dyspnea or shortness of breath Dermatological: negative for rash Respiratory: negative for cough or wheezing Urologic: negative for hematuria Abdominal: negative for nausea, vomiting, diarrhea,  bright red blood per rectum, melena, or hematemesis Neurologic: negative for visual changes, syncope, or dizziness All other systems reviewed and are otherwise negative except as noted above. He reports compliance with C-pap  Physical Exam: Blood pressure 97/52, pulse 72, temperature 98.1 F (36.7 C), temperature source Oral, resp. rate 18, height 5\' 6"  (1.676 m), weight 247 lb (112.038 kg), SpO2 100.00%.  General appearance: alert, cooperative, no distress and moderately obese Neck: no carotid bruit and no JVD Lungs: clear to auscultation bilaterally Heart: regular rate and rhythm Abdomen: soft, non-tender; bowel sounds normal; no masses,  no  organomegaly Extremities: extremities normal, atraumatic, no cyanosis or edema Pulses: 2+ and symmetric Skin: Skin color, texture, turgor normal. No rashes or lesions Neurologic: Grossly normal    Labs:   Results for orders placed during the hospital encounter of 04/09/14 (from the past 24 hour(s))  CBC WITH DIFFERENTIAL     Status: None   Collection Time    04/09/14  2:20 PM      Result Value Ref Range   WBC 8.6  4.0 - 10.5 K/uL   RBC 4.69  4.22 - 5.81 MIL/uL   Hemoglobin 14.4  13.0 - 17.0 g/dL   HCT 40.9  81.1 - 91.4 %   MCV 90.8  78.0 - 100.0 fL   MCH 30.7  26.0 - 34.0 pg   MCHC 33.8  30.0 - 36.0 g/dL   RDW 78.2  95.6 - 21.3 %   Platelets 203  150 - 400 K/uL   Neutrophils Relative % 66  43 - 77 %   Neutro Abs 5.7  1.7 - 7.7 K/uL   Lymphocytes Relative 27  12 - 46 %   Lymphs Abs 2.3  0.7 - 4.0 K/uL   Monocytes Relative 5  3 - 12 %   Monocytes Absolute 0.5  0.1 - 1.0 K/uL   Eosinophils Relative 2  0 - 5 %   Eosinophils Absolute 0.2  0.0 - 0.7 K/uL   Basophils Relative 0  0 - 1 %   Basophils Absolute 0.0  0.0 - 0.1 K/uL  COMPREHENSIVE METABOLIC PANEL     Status: Abnormal   Collection Time    04/09/14  2:20 PM      Result Value Ref Range   Sodium 141  137 - 147 mEq/L   Potassium 4.4  3.7 - 5.3 mEq/L   Chloride 103  96 - 112 mEq/L   CO2 26  19 - 32 mEq/L   Glucose, Bld 245 (*) 70 - 99 mg/dL   BUN 16  6 - 23 mg/dL   Creatinine, Ser 0.86  0.50 - 1.35 mg/dL   Calcium 9.9  8.4 - 57.8 mg/dL   Total Protein 7.3  6.0 - 8.3 g/dL   Albumin 3.9  3.5 - 5.2 g/dL   AST 18  0 - 37 U/L   ALT 22  0 - 53 U/L   Alkaline Phosphatase 73  39 - 117 U/L   Total Bilirubin 0.3  0.3 - 1.2 mg/dL   GFR calc non Af Amer >90  >90 mL/min   GFR calc Af Amer >90  >90 mL/min   Anion gap 12  5 - 15  TROPONIN I     Status: None   Collection Time    04/09/14  2:20 PM      Result Value Ref Range   Troponin I <0.30  <0.30 ng/mL  PRO B NATRIURETIC PEPTIDE     Status: None  Collection Time     04/09/14  2:20 PM      Result Value Ref Range   Pro B Natriuretic peptide (BNP) 13.7  0 - 125 pg/mL  PROTIME-INR     Status: None   Collection Time    04/09/14  2:20 PM      Result Value Ref Range   Prothrombin Time 12.6  11.6 - 15.2 seconds   INR 0.94  0.00 - 1.49  APTT     Status: None   Collection Time    04/09/14  2:20 PM      Result Value Ref Range   aPTT 32  24 - 37 seconds  GLUCOSE, CAPILLARY     Status: None   Collection Time    04/09/14  6:59 PM      Result Value Ref Range   Glucose-Capillary 76  70 - 99 mg/dL     Radiology/Studies: Dg Chest 2 View  04/09/2014   CLINICAL DATA:  48 year old male with chest pain and shortness of breath  EXAM: CHEST  2 VIEW  COMPARISON:  07/01/2011 and prior chest radiographs .  FINDINGS: The cardiomediastinal silhouette is unremarkable.  Mild peribronchial thickening is stable.  There is no evidence of focal airspace disease, pulmonary edema, suspicious pulmonary nodule/mass, pleural effusion, or pneumothorax. No acute bony abnormalities are identified.  Lower cervical spinal fusion changes noted.  IMPRESSION: No active cardiopulmonary disease.   Electronically Signed   By: Laveda AbbeJeff  Hu M.D.   On: 04/09/2014 15:27    EKG: NSR without acute changes  ASSESSMENT AND PLAN:  Principal Problem:   Unstable angina Active Problems:   CAD S/P percutaneous coronary angioplasty- LAD DES 2010   Type II or unspecified type diabetes mellitus without mention of complication, not stated as uncontrolled   Sleep apnea   Hepatitis C- treated 2002   HTN (hypertension)   Dyslipidemia  PLAN: Admit- NTG paste, he is on the cath board for tomorrow. [facility]- will continue.   Deland PrettySigned, KILROY,LUKE K, PA-C 04/09/2014, 7:07 PM  Patient seen with PA, agree with the above note.  Patient is having progressive exertional chest pain, today he had prolonged pain for about 2 hours after climbing steps that resolved with NTG finally.  Currently chest  pain-free.  He has history of CAD.  Symptoms concerning for unstable angina.  Agree with continued heparin gtt and cardiac cath in the morning.   Marca AnconaDalton Iola Turri 04/09/2014 8:01 PM

## 2014-04-09 NOTE — Progress Notes (Signed)
Placed pt. On CPAP auto titrate (min: 6, max: 17) via nasal mask. Pt. Is tolerating CPAP well at this time without any complications.

## 2014-04-09 NOTE — ED Provider Notes (Signed)
Medical screening examination/treatment/procedure(s) were conducted as a shared visit with non-physician practitioner(s) and myself.  I personally evaluated the patient during the encounter.   EKG Interpretation   Date/Time:  Monday April 09 2014 13:59:30 EDT Ventricular Rate:  74 PR Interval:  166 QRS Duration: 78 QT Interval:  352 QTC Calculation: 390 R Axis:   67 Text Interpretation:  Normal sinus rhythm Normal ECG No significant change  since last tracing Confirmed by [facility]  MD, Jonny RuizJOHN ([id number]) on 04/09/2014  2:00:48 PM     2 weeks gradual onset gradually worsening intermittent exertional chest discomfort radiating to neck jaw left arm with shortness of breath resolves with rest or nitroglycerin typically lasts less than 10 minutes it has lasted up to a few hours now feels back to baseline also feels generally fatigued and has been unable to walk upstairs the last couple weeks due to worsening exertional chest discomfort shortness of breath and fatigue. Has been using his CPAP. Denies recent weight gain or edema. Past medical history coronary artery disease, morbid obesity, cardiac arrest, MI, diabetes, heart failure, sleep apnea.  D/w [facility]; Pt agrees; pain free in ED; Cards recs start heparin and Pt aware; The patient appears reasonably stabilized for transfer considering the current resources, flow, and capabilities available in the ED at this time, and I doubt any other [facility] requiring further screening and/or treatment in the ED prior to transfer. 1625  Ricky HornJohn M [name], MD 04/17/14 1328

## 2014-04-09 NOTE — ED Notes (Signed)
Report to Candelaria ArenasJessica [facility].

## 2014-04-09 NOTE — ED Provider Notes (Signed)
CSN: [id number]     Arrival date & time 04/09/14  1345 History   First MD Initiated Contact with Patient 04/09/14 1416     Chief Complaint  Patient presents with  . Chest Pain     (Consider location/radiation/quality/duration/timing/severity/associated sxs/prior Treatment) HPI [name] is a 48 year old male with past medical history of MI, CAD AWMI with DES Xince EF 40% (08/2008) , HTN, obesity, diabetes who presents today with 2 weeks of exertional chest pain radiating to left shoulder, dyspnea, right jaw pain. Patient describes his chest pain as an intermittent "scratching" sensation which is identical to the chest discomfort he experienced in the past when he had an MI. He states the pain is made worse with movement, and he becomes short of breath with movement. Patient reports he noticed the jaw pain first, and then began having chest pain which has progressively gotten worse over the past 2 weeks. He states he was supposed to go see Dr. Verdis Prime with cardiology today for evaluation of potential elective catheterization however came to the ER instead. Patient also complains today of some "memory problems" which he states are limited to him forgetting where his keys are and misplacing other items in his house. Patient denies nausea, vomiting, dizziness, weakness, altered mental status, aphasia, dysphasia.   Past Medical History  Diagnosis Date  . Obstructive sleep apnea   . Hepatitis C 2002    with treatment  . Diabetes 10/2012    [facility]  . History of cardiovascular stress test 10/2008 & 10/2010    Normal repeat normal  . Coronary artery disease 08/2008    AWMI with DES Xince EF 40%  . Obesity (BMI 30-39.9)   . Cardiac arrest   . AMI (acute myocardial infarction)    Past Surgical History  Procedure Laterality Date  . Knee surgery Right   . Coronary angioplasty with stent placement    . Hernia repair Right   . Cervical fusion     Family History  Problem Relation Age of Onset  .  Heart disease Father    History  Substance Use Topics  . Smoking status: Former Smoker -- 20 years    Types: Cigarettes    Quit date: 05/20/2013  . Smokeless tobacco: Not on file  . Alcohol Use: No    Review of Systems  Constitutional: Positive for activity change and fatigue. Negative for fever and chills.  HENT: Negative for voice change.   Eyes: Negative for visual disturbance.  Respiratory: Negative for shortness of breath.   Cardiovascular: Positive for chest pain and leg swelling. Negative for palpitations.  Gastrointestinal: Negative for nausea, vomiting and abdominal pain.  Endocrine: Negative for polyuria.  Genitourinary: Negative for dysuria.  Musculoskeletal: Negative for neck stiffness.  Skin: Negative for rash.  Neurological: Positive for headaches. Negative for dizziness, syncope, weakness and numbness.  Psychiatric/Behavioral: Negative.       Allergies  Latex and Sulfa antibiotics  Home Medications   Prior to Admission medications   Medication Sig Start Date End Date Taking? Authorizing Provider  carvedilol (COREG) 6.25 MG tablet Take 6.25 mg by mouth 2 (two) times daily with a meal.     Yes Historical Provider, MD  aspirin 325 MG tablet Take 325 mg by mouth at bedtime.      Historical Provider, MD  nitroGLYCERIN (NITROSTAT) 0.4 MG SL tablet Place 0.4 mg under the tongue every 5 (five) minutes as needed. For chest pain     Historical Provider, MD  ramipril (  ALTACE) 2.5 MG tablet Take 2.5 mg by mouth at bedtime.      Historical Provider, MD  rosuvastatin (CRESTOR) 10 MG tablet Take 1 tablet (10 mg total) by mouth at bedtime. 06/13/13   Lyn Records III, MD  VITAMIN D, CHOLECALCIFEROL, PO Take 1 tablet by mouth daily.      Historical Provider, MD   BP 97/52  Pulse 71  Temp(Src) 98.7 F (37.1 C) (Oral)  Resp 18  Ht 5\' 6"  (1.676 m)  Wt 247 lb (112.038 kg)  BMI 39.89 kg/m2  SpO2 99% Physical Exam  Constitutional: He is oriented to person, place, and  time. He appears well-developed and well-nourished. No distress.  HENT:  Head: Normocephalic and atraumatic.  Eyes: Pupils are equal, round, and reactive to light. No scleral icterus.  Neck: Normal range of motion.  Cardiovascular: Normal rate, regular rhythm, S1 normal and S2 normal.   Murmur heard.  Systolic murmur is present with a grade of 1/6  Murmur best heard at left upper sternal border  Pulmonary/Chest: Effort normal and breath sounds normal. No accessory muscle usage. Not tachypneic. No respiratory distress.  Patient becomes short of breath after taking 2 or 3 steps.  Abdominal: Soft. Normal appearance and bowel sounds are normal. There is no tenderness.  Musculoskeletal: Normal range of motion.  Neurological: He is alert and oriented to person, place, and time.  Skin: Skin is warm and dry. He is not diaphoretic.  Psychiatric: He has a normal mood and affect.    ED Course  Procedures (including critical care time) Labs Review Labs Reviewed  COMPREHENSIVE METABOLIC PANEL - Abnormal; Notable for the following:    Glucose, Bld 245 (*)    All other components within normal limits  CBC WITH DIFFERENTIAL  TROPONIN I  PRO B NATRIURETIC PEPTIDE  PROTIME-INR  APTT    Imaging Review Dg Chest 2 View  04/09/2014   CLINICAL DATA:  48 year old male with chest pain and shortness of breath  EXAM: CHEST  2 VIEW  COMPARISON:  07/01/2011 and prior chest radiographs .  FINDINGS: The cardiomediastinal silhouette is unremarkable.  Mild peribronchial thickening is stable.  There is no evidence of focal airspace disease, pulmonary edema, suspicious pulmonary nodule/mass, pleural effusion, or pneumothorax. No acute bony abnormalities are identified.  Lower cervical spinal fusion changes noted.  IMPRESSION: No active cardiopulmonary disease.   Electronically Signed   By: Laveda Abbe M.D.   On: 04/09/2014 15:27     EKG Interpretation   Date/Time:  Monday April 09 2014 13:59:30 EDT Ventricular  Rate:  74 PR Interval:  166 QRS Duration: 78 QT Interval:  352 QTC Calculation: 390 R Axis:   67 Text Interpretation:  Normal sinus rhythm Normal ECG No significant change  since last tracing Confirmed by [facility]  MD, Jonny Ruiz ([id number]) on 04/09/2014  2:00:48 PM      MDM   Final diagnoses:  Unstable angina    48 year old male with past medical history of CAD, HTN, obesity, diabetes, sleep apnea, heart failure presenting with 2 week history of exertional chest pain, dyspnea, jaw pain, left shoulder pain. Suggestive of unstable angina. Patient states the symptoms feel identical to his previous MI. Was supposed to follow up with cardiology today however came to ER instead. Initial workup for potential unstable angina/MI with chest x-ray, CBC, CMP, troponin, BNP, EKG. Initial EKG on arrival shows sinus rhythm with no acute ectopy or injury.  4:00 PM: Patient's labs returned unremarkable. We'll contact cardiology this  time.  4:33 PM: Cardiology consult by Dr. Fonnie JarvisBednar. [facility]     Signed,  Ladona MowJoe Micah Galeno, PA-C 4:45 PM   This patient seen and discussed with Dr. Wayland SalinasJohn Yates, M.D.  Monte FantasiaJoseph W Murvin Gift, PA-C 04/09/14 [phone]

## 2014-04-10 ENCOUNTER — Encounter (HOSPITAL_COMMUNITY): Payer: Self-pay | Admitting: Physician Assistant

## 2014-04-10 ENCOUNTER — Encounter (HOSPITAL_COMMUNITY)
Admission: EM | Disposition: A | Discharge status: Home / Self Care | Payer: Self-pay | Source: Home / Self Care | Attending: Interventional Cardiology

## 2014-04-10 DIAGNOSIS — I200 Unstable angina: Secondary | ICD-10-CM

## 2014-04-10 DIAGNOSIS — G4730 Sleep apnea, unspecified: Secondary | ICD-10-CM

## 2014-04-10 DIAGNOSIS — E119 Type 2 diabetes mellitus without complications: Secondary | ICD-10-CM

## 2014-04-10 DIAGNOSIS — E785 Hyperlipidemia, unspecified: Secondary | ICD-10-CM

## 2014-04-10 HISTORY — PX: LEFT HEART CATHETERIZATION WITH CORONARY ANGIOGRAM: SHX5451

## 2014-04-10 LAB — PROTIME-INR
INR: 1.06 (ref 0.00–1.49)
Prothrombin Time: 13.8 seconds (ref 11.6–15.2)

## 2014-04-10 LAB — CBC
HCT: 38.7 % — ABNORMAL LOW (ref 39.0–52.0)
Hemoglobin: 12.8 g/dL — ABNORMAL LOW (ref 13.0–17.0)
MCH: 30 pg (ref 26.0–34.0)
MCHC: 33.1 g/dL (ref 30.0–36.0)
MCV: 90.8 fL (ref 78.0–100.0)
Platelets: 181 10*3/uL (ref 150–400)
RBC: 4.26 MIL/uL (ref 4.22–5.81)
RDW: 13.9 % (ref 11.5–15.5)
WBC: 9.2 10*3/uL (ref 4.0–10.5)

## 2014-04-10 LAB — BASIC METABOLIC PANEL
Anion gap: 12 (ref 5–15)
BUN: 15 mg/dL (ref 6–23)
CO2: 22 mEq/L (ref 19–32)
Calcium: 8.5 mg/dL (ref 8.4–10.5)
Chloride: 106 mEq/L (ref 96–112)
Creatinine, Ser: 0.92 mg/dL (ref 0.50–1.35)
GFR calc Af Amer: >90 mL/min (ref >90)
GFR calc non Af Amer: >90 mL/min (ref >90)
Glucose, Bld: 120 mg/dL — ABNORMAL HIGH (ref 70–99)
Potassium: 4 mEq/L (ref 3.7–5.3)
Sodium: 140 mEq/L (ref 137–147)

## 2014-04-10 LAB — LIPID PANEL
Cholesterol: 117 mg/dL (ref 0–200)
HDL: 34 mg/dL — ABNORMAL LOW (ref >39)
LDL Cholesterol: 29 mg/dL (ref 0–99)
Total CHOL/HDL Ratio: 3.4 RATIO
Triglycerides: 270 mg/dL — ABNORMAL HIGH (ref <150)
VLDL: 54 mg/dL — ABNORMAL HIGH (ref 0–40)

## 2014-04-10 LAB — HEPARIN LEVEL (UNFRACTIONATED): Heparin Unfractionated: 0.47 IU/mL (ref 0.30–0.70)

## 2014-04-10 LAB — TROPONIN I: Troponin I: <0.3 ng/mL (ref <0.30)

## 2014-04-10 LAB — GLUCOSE, CAPILLARY
Glucose-Capillary: 131 mg/dL — ABNORMAL HIGH (ref 70–99)
Glucose-Capillary: 117 mg/dL — ABNORMAL HIGH (ref 70–99)
Glucose-Capillary: 124 mg/dL — ABNORMAL HIGH (ref 70–99)

## 2014-04-10 SURGERY — LEFT HEART CATHETERIZATION WITH CORONARY ANGIOGRAM
Anesthesia: LOCAL

## 2014-04-10 MED ORDER — VERAPAMIL HCL 2.5 MG/ML IV SOLN
INTRAVENOUS | Status: AC
  Filled 2014-04-10: qty 2

## 2014-04-10 MED ORDER — SODIUM CHLORIDE 0.9 % IV SOLN
INTRAVENOUS | Status: DC
  Administered 2014-04-10: 18:00:00 via INTRAVENOUS

## 2014-04-10 MED ORDER — NITROGLYCERIN 1 MG/10 ML FOR IR/CATH LAB
INTRA_ARTERIAL | Status: AC
  Filled 2014-04-10: qty 10

## 2014-04-10 MED ORDER — METFORMIN HCL 500 MG PO TABS: 500.0000 mg | ORAL_TABLET | Freq: Two times a day (BID) | ORAL | Status: AC

## 2014-04-10 MED ORDER — HEPARIN SODIUM (PORCINE) 1000 UNIT/ML IJ SOLN
INTRAMUSCULAR | Status: AC
  Filled 2014-04-10: qty 1

## 2014-04-10 MED ORDER — MIDAZOLAM HCL 2 MG/2ML IJ SOLN
INTRAMUSCULAR | Status: AC
  Filled 2014-04-10: qty 2

## 2014-04-10 MED ORDER — ONDANSETRON HCL 4 MG/2ML IJ SOLN: 4.0000 mg | Freq: Four times a day (QID) | INTRAMUSCULAR | Status: DC | PRN

## 2014-04-10 MED ORDER — LIDOCAINE HCL (PF) 1 % IJ SOLN
INTRAMUSCULAR | Status: AC
  Filled 2014-04-10: qty 30

## 2014-04-10 MED ORDER — HEPARIN (PORCINE) IN NACL 2-0.9 UNIT/ML-% IJ SOLN
INTRAMUSCULAR | Status: AC
  Filled 2014-04-10: qty 1000

## 2014-04-10 MED ORDER — ACETAMINOPHEN 325 MG PO TABS: 650.0000 mg | ORAL_TABLET | ORAL | Status: DC | PRN

## 2014-04-10 MED ORDER — FENTANYL CITRATE 0.05 MG/ML IJ SOLN
INTRAMUSCULAR | Status: AC
  Filled 2014-04-10: qty 2

## 2014-04-10 NOTE — Progress Notes (Signed)
UR Completed Quamaine Webb Graves-Bigelow, RN,BSN [phone]  

## 2014-04-10 NOTE — CV Procedure (Addendum)
[name] is a 48 y.o. male   [id number]  [id number] LOCATION:  FACILITY: [facility]  PHYSICIAN: Lennette Biharihomas A. Kymberly Blomberg, MD, [facility] 10/24/1965   DATE OF PROCEDURE:  04/10/2014     CARDIAC CATHETERIZATION    HISTORY:    [name] is a 48 year old male who suffered an anterior wall myocardial infarction in January 2010 and underwent successful DES stenting to the LAD with a Xience stent.  He was admitted to the hospital with several weeks of recurrent chest pain, which he felt was similar to his previous discomfort.  Cardiac enzymes were negative.  He is now referred for definitive cardiac catheterization.   PROCEDURE: Right radial approach with coronary angiography and left ventriculography  [facility] in the postabsorptive state. The patient was premedicated with Versed 3 mg and fentanyl 25 mcg. A right radial approach was utilized after an Allen's test verified adequate circulation. The right radial artery was punctured via the Seldinger technique, and a 6 [place] Glidesheath Slender was inserted without difficulty.  A radial cocktail consisting of Verapamil, IV nitroglycerin, and lidocaine was administered. Weight adjusted heparin was administered. A safety J wire was advanced into the ascending aorta. Diagnostic catheterization was done with a 5 [place] TIG catheter. A 5 French pigtail catheter was used for left ventriculography. A TR radial band was applied for hemostasis. The patient left the catheterization laboratory in stable condition.   HEMODYNAMICS:   Central Aorta: 115/76  Left Ventricle: 155/22  ANGIOGRAPHY:   The left main coronary artery was angiographically normal and trifurcated into the LAD, a diminutive ramus intermediate and left circumflex coronary artery.   The LAD  gave rise to 2 major diagonal vessels and several septal perforating arteries. The vessel extended to the LV apex. There is a widely patent stent position after the  first diagonal septal perforating artery extending to proximal to the second diagonal vessel.  There is mild 20% narrowing beyond the stent just proximal to the second diagonal takeoff.  The remainder of the LAD was angiographically normal.    Ramus intermediate vessel was a diminutive vessel that was angiographically normal   The left circumflex coronary artery was anatomically normal and gave rise to one major obtuse marginal branch.   The RCA was a dominant vessel t gave rise to a large PDA, inferior LV  and PLA vessel. There were mild luminal regularities of approximately 10-20% in the mid RCA.    Left ventriculography revealed normal global LV contractility with an ejection fraction of 50-55%.  There was a suggestion of very minimal residual mid anterolateral hypocontractility  There was no evidence for mitral regurgitation.    Total contrast used: 70 cc Omnipaque   IMPRESSION:  Low-normal LV function with an ejection fraction of 50-55% with very minimal region of possible residual mild hypocontractility in the mid anterolateral wall.  No significant coronary obstructive disease with evidence for a widely patent proximal LAD stent with mild 20% narrowing beyond the stented segment proximal to the second diagonal vessel, normal small ramus intermediate, left circumflex coronary arteries, and mild luminal irregularity with 10-20% narrowings in a large, dominant RCA.   RECOMMENDATION:  Medical therapy   Lennette Biharihomas A. Lisbeth Puller, MD, [facility] 04/10/2014 2:46 PM

## 2014-04-10 NOTE — H&P (View-Only) (Signed)
48 year old male with HX of acute ST elevation MI, PCI with DES-Xience stent to LAD.  EF 40-45% but improved to 59%, hx of hep C treated with interferon, now presents with 3 weeks of SSCP. Very much like previous angina   For cath today.  Subjective:  some chest pain this am, once he sat up he felt better.  Objective: Vital signs in last 24 hours: Temp:  [98.1 F (36.7 C)-98.7 F (37.1 C)] 98.5 F (36.9 C) (08/11 0612) Pulse Rate:  [68-81] 75 (08/11 0612) Resp:  [18] 18 (08/11 0612) BP: (91-138)/(46-85) 114/77 mmHg (08/11 0612) SpO2:  [98 %-100 %] 98 % (08/11 0612) Weight:  [247 lb (112.038 kg)-250 lb 12.8 oz (113.762 kg)] 250 lb 12.8 oz (113.762 kg) (08/11 0612) Weight change:  Last BM Date: 04/08/14 Intake/Output from previous day: +1107 08/10 0701 - 08/11 0700 In: 1507.5 [P.O.:240; I.V.:1267.5] Out: 400 [Urine:400] Intake/Output this shift:    PE: General:Pleasant affect, NAD Skin:Warm and dry, brisk capillary refill HEENT:normocephalic, sclera clear, mucus membranes moist Neck:supple, no JVD, no bruits  Heart:S1S2 RRR without murmur, gallup, rub or click Lungs:clear without rales, rhonchi, or wheezes JXB:JYNWGAbd:obese, soft, non tender, + BS, do not palpate liver spleen or masses Ext:no lower ext edema, 2+ pedal pulses, 2+ radial pulses Neuro:alert and oriented, MAE, follows commands, + facial symmetry   Lab Results:  Recent Labs  04/09/14 2051 04/10/14 0444  WBC 10.1 9.2  HGB 13.7 12.8*  HCT 40.5 38.7*  PLT 186 181   BMET  Recent Labs  04/09/14 1420 04/09/14 2051 04/10/14 0444  NA 141  --  140  K 4.4  --  4.0  CL 103  --  106  CO2 26  --  22  GLUCOSE 245*  --  120*  BUN 16  --  15  CREATININE 0.90 0.88 0.92  CALCIUM 9.9  --  8.5    Recent Labs  04/09/14 2051 04/10/14 0444  TROPONINI <0.30 <0.30    Lab Results  Component Value Date   CHOL 117 04/10/2014   HDL 34* 04/10/2014   LDLCALC 29 04/10/2014   TRIG 270* 04/10/2014   CHOLHDL 3.4  04/10/2014   No results found for this basename: HGBA1C      Hepatic Function Panel  Recent Labs  04/09/14 1420  PROT 7.3  ALBUMIN 3.9  AST 18  ALT 22  ALKPHOS 73  BILITOT 0.3    Recent Labs  04/10/14 0444  CHOL 117   Lipid Panel     Component Value Date/Time   CHOL 117 04/10/2014 0444   TRIG 270* 04/10/2014 0444   HDL 34* 04/10/2014 0444   CHOLHDL 3.4 04/10/2014 0444   VLDL 54* 04/10/2014 0444   LDLCALC 29 04/10/2014 0444      Studies/Results: Dg Chest 2 View  04/09/2014   CLINICAL DATA:  48 year old male with chest pain and shortness of breath  EXAM: CHEST  2 VIEW  COMPARISON:  07/01/2011 and prior chest radiographs .  FINDINGS: The cardiomediastinal silhouette is unremarkable.  Mild peribronchial thickening is stable.  There is no evidence of focal airspace disease, pulmonary edema, suspicious pulmonary nodule/mass, pleural effusion, or pneumothorax. No acute bony abnormalities are identified.  Lower cervical spinal fusion changes noted.  IMPRESSION: No active cardiopulmonary disease.   Electronically Signed   By: Laveda AbbeJeff  Hu M.D.   On: 04/09/2014 15:27    Medications: I have reviewed the patient's current medications. Scheduled Meds: .  aspirin  325 mg Oral Daily  . atorvastatin  20 mg Oral q1800  . carvedilol  6.25 mg Oral BID WC  . insulin aspart  0-15 Units Subcutaneous TID WC  . insulin aspart  0-5 Units Subcutaneous QHS  . nitroGLYCERIN  0.5 inch Topical 4 times per day  . ramipril  2.5 mg Oral QHS   Continuous Infusions: . sodium chloride 1 mL/kg/hr (04/10/14 0510)  . heparin 1,400 Units/hr (04/10/14 0616)   PRN Meds:.acetaminophen, ALPRAZolam, nitroGLYCERIN, ondansetron (ZOFRAN) IV, zolpidem  Assessment/Plan: Principal Problem:   Unstable angina- negative MI; EKG SR no acute changes for cath today Active Problems:   Sleep apnea   CAD S/P percutaneous coronary angioplasty- LAD DES 2010   Hepatitis C- treated 2002   HTN (hypertension)   Type II or  unspecified type diabetes mellitus without mention of complication, not stated as uncontrolled   Dyslipidemia- on crestor 10 mg        LOS: 1 day   Time spent with pt. :15 minutes. [facility]  Nurse Practitioner Certified Pager [phone] or after 5pm and on weekends call [phone] 04/10/2014, 9:10 AM

## 2014-04-10 NOTE — Progress Notes (Signed)
TR band deflated progressively and then removed from right wrist without any complications. No bleeding, tingling or swelling noted. Radial pulse palpated. VS WNL and stable. Transparent dressing with Tegaderm applied.  Cesar Dorsel Flinn,RN 

## 2014-04-10 NOTE — Interval H&P Note (Signed)
Cath Lab Visit (complete for each Cath Lab visit)  Clinical Evaluation Leading to the Procedure:   ACS: No.  Non-ACS:    Anginal Classification: CCS III  Anti-ischemic medical therapy: Maximal Therapy (2 or more classes of medications)  Non-Invasive Test Results: No non-invasive testing performed  Prior CABG: No previous CABG      History and Physical Interval Note:  04/10/2014 1:54 PM  Ricky HolmesPeter B Yates  has presented today for surgery, with the diagnosis of cp  The various methods of treatment have been discussed with the patient and family. After consideration of risks, benefits and other options for treatment, the patient has consented to  Procedure(s): LEFT HEART CATHETERIZATION WITH CORONARY ANGIOGRAM (N/A) as a surgical intervention .  The patient's history has been reviewed, patient examined, no change in status, stable for surgery.  I have reviewed the patient's chart and labs.  Questions were answered to the patient's satisfaction.     [name] A

## 2014-04-10 NOTE — Progress Notes (Signed)
Pt. Seen and examined. Agree with the NP/PA-C note as written.  SSCP which has improved somewhat. Symptoms are similar to his prior CAD. Plan for LHC today. Cardiac enzymes are negative overnight.  Kenneth C. Hilty, MD, FACC Attending Cardiologist [facility]   

## 2014-04-10 NOTE — Progress Notes (Signed)
ANTICOAGULATION CONSULT NOTE Pharmacy Consult for heparin Indication: chest pain/ACS  Allergies  Allergen Reactions  . Latex Shortness Of Breath, Itching and Rash  . Sulfa Antibiotics Other (See Comments)    unknown    Patient Measurements: Height: 5\' 6"  (167.6 cm) Weight: 249 lb (112.946 kg) IBW/kg (Calculated) : 63.8 Heparin Dosing Weight: 89.6kg  Vital Signs: Temp: 98.4 F (36.9 C) (08/10 1912) Temp src: Oral (08/10 1912) BP: 103/69 mmHg (08/10 2211) Pulse Rate: 68 (08/10 2305)  Labs:  Recent Labs  04/09/14 1420 04/09/14 2051 04/09/14 2237  HGB 14.4 13.7  --   HCT 42.6 40.5  --   PLT 203 186  --   APTT 32  --   --   LABPROT 12.6  --   --   INR 0.94  --   --   HEPARINUNFRC  --   --  0.28*  CREATININE 0.90 0.88  --   TROPONINI <0.30 <0.30  --     Estimated Creatinine Clearance: 119.8 ml/min (by C-G formula based on Cr of 0.88).  Assessment: 48 y.o. male with chest pain for heparin   Goal of Therapy:  Heparin level 0.3-0.7 units/ml Monitor platelets by anticoagulation protocol: Yes   Plan:  Increase Heparin 1400 units/hr Follow-up am labs.   Geannie RisenGreg Heaven Wandell, PharmD, BCPS  04/10/2014 12:54 AM

## 2014-04-10 NOTE — Discharge Instructions (Signed)
PLEASE REMEMBER TO BRING ALL OF YOUR MEDICATIONS TO EACH OF YOUR FOLLOW-UP OFFICE VISITS. ° °PLEASE ATTEND ALL SCHEDULED FOLLOW-UP APPOINTMENTS.  ° °Activity: Increase activity slowly as tolerated. You may shower, but no soaking baths (or swimming) for 1 week. No driving for 2 days. No lifting over 5 lbs for 1 week. No sexual activity for 1 week.  ° °You May Return to Work: in 1 week (if applicable) ° °Wound Care: You may wash cath site gently with soap and water. Keep cath site clean and dry. If you notice pain, swelling, bleeding or pus at your cath site, please call [phone]. ° ° ° °Cardiac Cath Site Care °Refer to this sheet in the next few weeks. These instructions provide you with information on caring for yourself after your procedure. Your caregiver may also give you more specific instructions. Your treatment has been planned according to current medical practices, but problems sometimes occur. Call your caregiver if you have any problems or questions after your procedure. °HOME CARE INSTRUCTIONS °· You may shower 24 hours after the procedure. Remove the bandage (dressing) and gently wash the site with plain soap and water. Gently pat the site dry.  °· Do not apply powder or lotion to the site.  °· Do not sit in a bathtub, swimming pool, or whirlpool for 5 to 7 days.  °· No bending, squatting, or lifting anything over 10 pounds (4.5 kg) as directed by your caregiver.  °· Inspect the site at least twice daily.  °· Do not drive home if you are discharged the same day of the procedure. Have someone else drive you.  °· You may drive 24 hours after the procedure unless otherwise instructed by your caregiver.  °What to expect: °· Any bruising will usually fade within 1 to 2 weeks.  °· Blood that collects in the tissue (hematoma) may be painful to the touch. It should usually decrease in size and tenderness within 1 to 2 weeks.  °SEEK IMMEDIATE MEDICAL CARE IF: °· You have unusual pain at the site or down the  affected limb.  °· You have redness, warmth, swelling, or pain at the site.  °· You have drainage (other than a small amount of blood on the dressing).  °· You have chills.  °· You have a fever or persistent symptoms for more than 72 hours.  °· You have a fever and your symptoms suddenly get worse.  °· Your leg becomes pale, cool, tingly, or numb.  °· You have heavy bleeding from the site. Hold pressure on the site.  °Document Released: 09/19/2010 Document Revised: 08/06/2011 Document Reviewed: 09/19/2010 °ExitCare® Patient Information ©2012 [employer]. ° °

## 2014-04-10 NOTE — Care Management Note (Unsigned)
    Page 1 of 1   04/10/2014     11:45:57 AM CARE MANAGEMENT NOTE 04/10/2014  Patient:  Matt HolmesHOLDEN,Lovelle B   Account Number:  [id number]  Date Initiated:  04/10/2014  Documentation initiated by:  GRAVES-BIGELOW,Shemekia Patane  Subjective/Objective Assessment:   Pt admitted for unstable angina. Hx CAD S/P percutaneous coronary angioplasty- LAD DES 2010. Plan for LHC today.     Action/Plan:   CM will continue to monitor for disposition needs.   Anticipated DC Date:  04/12/2014   Anticipated DC Plan:  HOME/SELF CARE      DC Planning Services  CM consult      Choice offered to / List presented to:             Status of service:  In process, will continue to follow Medicare Important Message given?  NO (If response is "NO", the following Medicare IM given date fields will be blank) Date Medicare IM given:   Medicare IM given by:   Date Additional Medicare IM given:   Additional Medicare IM given by:    Discharge Disposition:    Per UR Regulation:  Reviewed for med. necessity/level of care/duration of stay  If discussed at Long Length of Stay Meetings, dates discussed:    Comments:

## 2014-04-10 NOTE — H&P (View-Only) (Signed)
Pt. Seen and examined. Agree with the NP/PA-C note as written.  SSCP which has improved somewhat. Symptoms are similar to his prior CAD. Plan for LHC today. Cardiac enzymes are negative overnight.  Chrystie NoseKenneth C. Hilty, MD, [facility] AtlantaFACC Attending Cardiologist [facility]

## 2014-04-10 NOTE — Progress Notes (Signed)
48 year old male with HX of acute ST elevation MI, PCI with DES-Xience stent to LAD.  EF 40-45% but improved to 59%, hx of hep C treated with interferon, now presents with 3 weeks of SSCP. Very much like previous angina   For cath today.  Subjective:  some chest pain this am, once he sat up he felt better.  Objective: Vital signs in last 24 hours: Temp:  [98.1 F (36.7 C)-98.7 F (37.1 C)] 98.5 F (36.9 C) (08/11 0612) Pulse Rate:  [68-81] 75 (08/11 0612) Resp:  [18] 18 (08/11 0612) BP: (91-138)/(46-85) 114/77 mmHg (08/11 0612) SpO2:  [98 %-100 %] 98 % (08/11 0612) Weight:  [247 lb (112.038 kg)-250 lb 12.8 oz (113.762 kg)] 250 lb 12.8 oz (113.762 kg) (08/11 0612) Weight change:  Last BM Date: 04/08/14 Intake/Output from previous day: +1107 08/10 0701 - 08/11 0700 In: 1507.5 [P.O.:240; I.V.:1267.5] Out: 400 [Urine:400] Intake/Output this shift:    PE: General:Pleasant affect, NAD Skin:Warm and dry, brisk capillary refill HEENT:normocephalic, sclera clear, mucus membranes moist Neck:supple, no JVD, no bruits  Heart:S1S2 RRR without murmur, gallup, rub or click Lungs:clear without rales, rhonchi, or wheezes JXB:JYNWGAbd:obese, soft, non tender, + BS, do not palpate liver spleen or masses Ext:no lower ext edema, 2+ pedal pulses, 2+ radial pulses Neuro:alert and oriented, MAE, follows commands, + facial symmetry   Lab Results:  Recent Labs  04/09/14 2051 04/10/14 0444  WBC 10.1 9.2  HGB 13.7 12.8*  HCT 40.5 38.7*  PLT 186 181   BMET  Recent Labs  04/09/14 1420 04/09/14 2051 04/10/14 0444  NA 141  --  140  K 4.4  --  4.0  CL 103  --  106  CO2 26  --  22  GLUCOSE 245*  --  120*  BUN 16  --  15  CREATININE 0.90 0.88 0.92  CALCIUM 9.9  --  8.5    Recent Labs  04/09/14 2051 04/10/14 0444  TROPONINI <0.30 <0.30    Lab Results  Component Value Date   CHOL 117 04/10/2014   HDL 34* 04/10/2014   LDLCALC 29 04/10/2014   TRIG 270* 04/10/2014   CHOLHDL 3.4  04/10/2014   No results found for this basename: HGBA1C      Hepatic Function Panel  Recent Labs  04/09/14 1420  PROT 7.3  ALBUMIN 3.9  AST 18  ALT 22  ALKPHOS 73  BILITOT 0.3    Recent Labs  04/10/14 0444  CHOL 117   Lipid Panel     Component Value Date/Time   CHOL 117 04/10/2014 0444   TRIG 270* 04/10/2014 0444   HDL 34* 04/10/2014 0444   CHOLHDL 3.4 04/10/2014 0444   VLDL 54* 04/10/2014 0444   LDLCALC 29 04/10/2014 0444      Studies/Results: Dg Chest 2 View  04/09/2014   CLINICAL DATA:  48 year old male with chest pain and shortness of breath  EXAM: CHEST  2 VIEW  COMPARISON:  07/01/2011 and prior chest radiographs .  FINDINGS: The cardiomediastinal silhouette is unremarkable.  Mild peribronchial thickening is stable.  There is no evidence of focal airspace disease, pulmonary edema, suspicious pulmonary nodule/mass, pleural effusion, or pneumothorax. No acute bony abnormalities are identified.  Lower cervical spinal fusion changes noted.  IMPRESSION: No active cardiopulmonary disease.   Electronically Signed   By: Laveda AbbeJeff  Hu M.D.   On: 04/09/2014 15:27    Medications: I have reviewed the patient's current medications. Scheduled Meds: .  aspirin  325 mg Oral Daily  . atorvastatin  20 mg Oral q1800  . carvedilol  6.25 mg Oral BID WC  . insulin aspart  0-15 Units Subcutaneous TID WC  . insulin aspart  0-5 Units Subcutaneous QHS  . nitroGLYCERIN  0.5 inch Topical 4 times per day  . ramipril  2.5 mg Oral QHS   Continuous Infusions: . sodium chloride 1 mL/kg/hr (04/10/14 0510)  . heparin 1,400 Units/hr (04/10/14 0616)   PRN Meds:.acetaminophen, ALPRAZolam, nitroGLYCERIN, ondansetron (ZOFRAN) IV, zolpidem  Assessment/Plan: Principal Problem:   Unstable angina- negative MI; EKG SR no acute changes for cath today Active Problems:   Sleep apnea   CAD S/P percutaneous coronary angioplasty- LAD DES 2010   Hepatitis C- treated 2002   HTN (hypertension)   Type II or  unspecified type diabetes mellitus without mention of complication, not stated as uncontrolled   Dyslipidemia- on crestor 10 mg        LOS: 1 day   Time spent with pt. :15 minutes. [facility]  Nurse Practitioner Certified Pager [phone] or after 5pm and on weekends call [phone] 04/10/2014, 9:10 AM

## 2014-04-10 NOTE — Discharge Summary (Signed)
CARDIOLOGY DISCHARGE SUMMARY   Patient ID: [name] B Munshi MRN: [id number] DOB/AGE: 50/03/1966 50 y.o.  Admit date: 04/09/2014 Discharge date: 04/10/2014  PCP: Allean FoundSMITH,CANDACE THIELE, MD Primary Cardiologist: Dr. Katrinka BlazingSmith  Primary Discharge Diagnosis:   Unstable angina Secondary Discharge Diagnosis:    Sleep apnea   CAD S/P percutaneous coronary angioplasty- LAD DES 2010   Hepatitis C- treated 2002   HTN (hypertension)   Type II or unspecified type diabetes mellitus without mention of complication, not stated as uncontrolled   Dyslipidemia  Procedures: Cardiac catheterization, coronary arteriogram, left ventriculogram  Hospital Course: [name] B Soliz is a 48 y.o. male with a history of CAD. He had a STEMI in 2010, requiring an LAD stent. He developed chest pain that reminded him of his previous MI pain and [facility]. [facility] for further evaluation and treatment.  His blood sugars were managed with a combination of his home medications and sliding scale insulin. Lipid profile was performed, results are below. He has some elevation in his triglycerides and his HDL is low, but no med changes were made at this time.  His cardiac enzymes were negative for MI. His symptoms were concerning for unstable anginal pain so he was taken to the cath lab on 08/11. Cardiac catheterization results are below.  His LAD stent was patent and he had nonobstructive disease. His EF was 50-55%. Medical therapy was recommended.  Post-cath, his cath site is without hematoma or ecchymosis. His chest pain has resolved. No further inpatient workup is indicated and he is considered stable for discharge, to follow up as an outpatient.  Labs:   Lab Results  Component Value Date   WBC 9.2 04/10/2014   HGB 12.8* 04/10/2014   HCT 38.7* 04/10/2014   MCV 90.8 04/10/2014   PLT 181 04/10/2014    Recent Labs Lab 04/09/14 1420   04/10/14 0444  NA 141  --  140  K 4.4  --  4.0  CL 103  --  106  CO2 26  --  22  BUN 16  --  15  CREATININE 0.90  < > 0.92  CALCIUM 9.9  --  8.5  PROT 7.3  --   --   BILITOT 0.3  --   --   ALKPHOS 73  --   --   ALT 22  --   --   AST 18  --   --   GLUCOSE 245*  --  120*  < > = values in this interval not displayed.  Recent Labs  04/09/14 1420 04/09/14 2051 04/10/14 0444  TROPONINI <0.30 <0.30 <0.30   Lipid Panel     Component Value Date/Time   CHOL 117 04/10/2014 0444   TRIG 270* 04/10/2014 0444   HDL 34* 04/10/2014 0444   CHOLHDL 3.4 04/10/2014 0444   VLDL 54* 04/10/2014 0444   LDLCALC 29 04/10/2014 0444    Pro B Natriuretic peptide (BNP)  Date/Time Value Ref Range Status  04/09/2014  2:20 PM 13.7  0 - 125 pg/mL Final    Recent Labs  04/10/14 0600  INR 1.06      Radiology: Dg Chest 2 View 04/09/2014   CLINICAL DATA:  48 year old male with chest pain and shortness of breath  EXAM: CHEST  2 VIEW  COMPARISON:  07/01/2011 and prior chest radiographs .  FINDINGS: The cardiomediastinal silhouette is unremarkable.  Mild peribronchial thickening is  stable.  There is no evidence of focal airspace disease, pulmonary edema, suspicious pulmonary nodule/mass, pleural effusion, or pneumothorax. No acute bony abnormalities are identified.  Lower cervical spinal fusion changes noted.  IMPRESSION: No active cardiopulmonary disease.   Electronically Signed   By: Laveda Abbe M.D.   On: 04/09/2014 15:27    Cardiac Cath: 04/10/2014 ANGIOGRAPHY:  The left main coronary artery was angiographically normal and trifurcated into the LAD, a diminutive ramus intermediate and left circumflex coronary artery.  The LAD gave rise to 2 major diagonal vessels and several septal perforating arteries. The vessel extended to the LV apex. There is a widely patent stent position after the first diagonal septal perforating artery extending to proximal to the second diagonal vessel. There is mild 20% narrowing  beyond the stent just proximal to the second diagonal takeoff. The remainder of the LAD was angiographically normal.  Ramus intermediate vessel was a diminutive vessel that was angiographically normal  The left circumflex coronary artery was anatomically normal and gave rise to one major obtuse marginal branch.  The RCA was a dominant vessel t gave rise to a large PDA, inferior LV and PLA vessel. There were mild luminal regularities of approximately 10-20% in the mid RCA.  Left ventriculography revealed normal global LV contractility with an ejection fraction of 50-55%. There was a suggestion of very minimal residual mid anterolateral hypocontractility There was no evidence for mitral regurgitation.  Total contrast used: 70 cc Omnipaque  IMPRESSION:  Low-normal LV function with an ejection fraction of 50-55% with very minimal region of possible residual mild hypocontractility in the mid anterolateral wall.  No significant coronary obstructive disease with evidence for a widely patent proximal LAD stent with mild 20% narrowing beyond the stented segment proximal to the second diagonal vessel, normal small ramus intermediate, left circumflex coronary arteries, and mild luminal irregularity with 10-20% narrowings in a large, dominant RCA.  RECOMMENDATION:  Medical therapy  EKG: 04/09/2014 Sinus rhythm, no acute ischemic changes  FOLLOW UP PLANS AND APPOINTMENTS Allergies  Allergen Reactions  . Latex Shortness Of Breath, Itching and Rash  . Sulfa Antibiotics Other (See Comments)    unknown     Medication List         aspirin 325 MG tablet  Take 325 mg by mouth every morning.     carvedilol 6.25 MG tablet  Commonly known as:  COREG  Take 6.25 mg by mouth 2 (two) times daily with a meal.     metFORMIN 500 MG tablet  Commonly known as:  GLUCOPHAGE  Take 1 tablet (500 mg total) by mouth 2 (two) times daily with a meal. HOLD for 2 days, restart on 04/13/2014     nitroGLYCERIN 0.4 MG SL  tablet  Commonly known as:  NITROSTAT  Place 0.4 mg under the tongue every 5 (five) minutes as needed for chest pain.     ramipril 2.5 MG tablet  Commonly known as:  ALTACE  Take 2.5 mg by mouth at bedtime.     rosuvastatin 10 MG tablet  Commonly known as:  CRESTOR  Take 1 tablet (10 mg total) by mouth at bedtime.     VITAMIN B-12 PO  Take 1 tablet by mouth daily.     VITAMIN D (CHOLECALCIFEROL) PO  Take 1 tablet by mouth daily.     vitamin E 1000 UNIT capsule  Take 1,000 Units by mouth daily.         Follow-up Information   Follow up with  Lesleigh Noe, MD. (The office will call)    Specialty:  Cardiology   Contact information:   [address] [place] [address] [phone]       BRING ALL MEDICATIONS WITH YOU TO FOLLOW UP APPOINTMENTS  Time spent with patient to include physician time: 36 min Signed: Theodore Demark, PA-C 04/10/2014, 5:33 PM Co-Sign MD

## 2014-04-11 ENCOUNTER — Telehealth: Payer: Self-pay | Admitting: Cardiology

## 2014-04-12 ENCOUNTER — Encounter: Payer: Self-pay | Admitting: Cardiovascular Disease

## 2014-04-12 NOTE — Telephone Encounter (Signed)
Closed encounter °

## 2014-05-04 ENCOUNTER — Ambulatory Visit: Payer: BC Managed Care – PPO | Admitting: Cardiology

## 2014-05-31 ENCOUNTER — Encounter: Payer: Self-pay | Admitting: Cardiology

## 2014-07-17 ENCOUNTER — Other Ambulatory Visit: Payer: Self-pay

## 2014-07-17 MED ORDER — ROSUVASTATIN CALCIUM 10 MG PO TABS: 10.0000 mg | ORAL_TABLET | Freq: Every day | ORAL | Status: DC

## 2014-08-09 ENCOUNTER — Encounter (HOSPITAL_COMMUNITY): Payer: Self-pay | Admitting: Cardiovascular Disease

## 2014-12-19 ENCOUNTER — Encounter: Payer: Self-pay | Admitting: Interventional Cardiology

## 2014-12-19 ENCOUNTER — Ambulatory Visit (INDEPENDENT_AMBULATORY_CARE_PROVIDER_SITE_OTHER): Payer: BLUE CROSS/BLUE SHIELD | Admitting: Interventional Cardiology

## 2014-12-19 VITALS — BP 108/70 | HR 77 | Ht 66.0 in | Wt 252.2 lb

## 2014-12-19 DIAGNOSIS — G4730 Sleep apnea, unspecified: Secondary | ICD-10-CM

## 2014-12-19 DIAGNOSIS — I509 Heart failure, unspecified: Secondary | ICD-10-CM

## 2014-12-19 DIAGNOSIS — I10 Essential (primary) hypertension: Secondary | ICD-10-CM

## 2014-12-19 DIAGNOSIS — I5030 Unspecified diastolic (congestive) heart failure: Secondary | ICD-10-CM

## 2014-12-19 DIAGNOSIS — E785 Hyperlipidemia, unspecified: Secondary | ICD-10-CM

## 2014-12-19 DIAGNOSIS — Z9861 Coronary angioplasty status: Secondary | ICD-10-CM

## 2014-12-19 DIAGNOSIS — I2510 Atherosclerotic heart disease of native coronary artery without angina pectoris: Secondary | ICD-10-CM | POA: Diagnosis not present

## 2014-12-19 NOTE — Patient Instructions (Addendum)
Medication Instructions:  Your physician recommends that you continue on your current medications as directed. Please refer to the Current Medication list given to you today.  Labwork: Your physician recommends that you return for a FASTING lipid profile and alt in August 2016  Testing/Procedures: None   Follow-Up: Your physician wants you to follow-up in: 1 year with Dr.Smith You will receive a reminder letter in the mail two months in advance. If you don't receive a letter, please call our office to schedule the follow-up appointment.  Any Other Special Instructions Will Be Listed Below (If Applicable). Your physician discussed the importance of regular exercise and recommended that you start or continue a regular exercise program for good health.

## 2014-12-19 NOTE — Progress Notes (Signed)
Cardiology Office Note   Date:  12/19/2014   ID:  [name], DOB [birth year], MRN [id number]  PCP:  Allean Found, MD  Cardiologist:   Ricky Noe, MD   Chief Complaint  Patient presents with  . Coronary Artery Disease      History of Present Illness: [name] is a 49 y.o. male who presents for CAD, Hypertension, and hyperlipidemia. History of LAD stent. Recent catheterization 2015 with widely patent 3 vessel coronary anatomy  [name] is doing well. He had chest pain in October that led to coronary angiography which demonstrated a widely patent LAD stent. No significant disease in other territories was noted. He's been under a lot of stress at work. He denies orthopnea, PND, lower extremity swelling, palpitations, and syncope. He is very active at work. He has no limitations.. He works for [employer].    Past Medical History  Diagnosis Date  . Obstructive sleep apnea   . Hepatitis C 2002    with treatment  . Diabetes 10/2012    [facility]  . History of cardiovascular stress test 10/2008 & 10/2010    Normal repeat normal EF 59% 2012  . Coronary artery disease 08/2008    AWMI with DES Xince EF 40%  . Obesity (BMI 30-39.9)   . AMI (acute myocardial infarction) 2010    Past Surgical History  Procedure Laterality Date  . Knee surgery Right   . Coronary angioplasty with stent placement  2010    Xience stent to the LAD  . Hernia repair Right   . Cervical fusion    . Left heart catheterization with coronary angiogram N/A 04/10/2014    Procedure: LEFT HEART CATHETERIZATION WITH CORONARY ANGIOGRAM;  Surgeon: Lennette Bihari, MD;  Location: [facility] CATH LAB;  Service: Cardiovascular;  Laterality: N/A;     Current Outpatient Prescriptions  Medication Sig Dispense Refill  . aspirin 325 MG tablet Take 325 mg by mouth every morning.     . carvedilol (COREG) 6.25 MG tablet Take 6.25 mg by mouth 2 (two) times daily with a meal.      . Cyanocobalamin (VITAMIN  B-12 PO) Take 1 tablet by mouth daily.    . metFORMIN (GLUCOPHAGE) 500 MG tablet Take 1 tablet (500 mg total) by mouth 2 (two) times daily with a meal. HOLD for 2 days, restart on 04/13/2014    . nitroGLYCERIN (NITROSTAT) 0.4 MG SL tablet Place 0.4 mg under the tongue every 5 (five) minutes as needed for chest pain.     . ramipril (ALTACE) 2.5 MG tablet Take 2.5 mg by mouth at bedtime.      . rosuvastatin (CRESTOR) 10 MG tablet Take 1 tablet (10 mg total) by mouth at bedtime. 30 tablet 5  . traZODone (DESYREL) 50 MG tablet Take 50 mg by mouth at bedtime.    Marland Kitchen VITAMIN D, CHOLECALCIFEROL, PO Take 1 tablet by mouth daily.      . vitamin E 1000 UNIT capsule Take 1,000 Units by mouth daily.     No current facility-administered medications for this visit.    Allergies:   Latex and Sulfa antibiotics    Social History:  The patient  reports that he quit smoking about 19 months ago. His smoking use included Cigarettes. He quit after 20 years of use. He does not have any smokeless tobacco history on file. He reports that he does not drink alcohol or use illicit drugs.   Family History:  The patient's  family history includes Colon cancer in his mother; Heart disease in his father.    ROS:  Please see the history of present illness.   Otherwise, review of systems are positive for left knee pain.   All other systems are reviewed and negative.    PHYSICAL EXAM: VS:  BP 108/70 mmHg  Pulse 77  Ht 5\' 6"  (1.676 m)  Wt 252 lb 3.2 oz (114.397 kg)  BMI 40.73 kg/m2 , BMI Body mass index is 40.73 kg/(m^2). GEN: Well nourished, well developed, in no acute distress, Obese HEENT: normal Neck: no JVD, carotid bruits, or masses Cardiac: RRR; no murmurs, rubs, or gallops,no edema  Respiratory:  clear to auscultation bilaterally, normal work of breathing GI: soft, nontender, nondistended, + BS MS: no deformity or atrophy Skin: warm and dry, no rash Neuro:  Strength and sensation are intact Psych: euthymic  mood, full affect   EKG:  EKG is not ordered today.   Recent Labs: 04/09/2014: ALT 22; Pro B Natriuretic peptide (BNP) 13.7 04/10/2014: BUN 15; Creatinine 0.92; Hemoglobin 12.8*; Platelets 181; Potassium 4.0; Sodium 140    Lipid Panel    Component Value Date/Time   CHOL 117 04/10/2014 0444   TRIG 270* 04/10/2014 0444   HDL 34* 04/10/2014 0444   CHOLHDL 3.4 04/10/2014 0444   VLDL 54* 04/10/2014 0444   LDLCALC 29 04/10/2014 0444      Wt Readings from Last 3 Encounters:  12/19/14 252 lb 3.2 oz (114.397 kg)  04/10/14 250 lb 12.8 oz (113.762 kg)  12/11/13 249 lb (112.946 kg)      Other studies Reviewed: Additional studies/ records that were reviewed today include: . Review of the above records demonstrates: Reviewed coronary angiogram results from recent catheterization and indeed his arteries are widely patent.   ASSESSMENT AND PLAN:  CAD S/P percutaneous coronary angioplasty- LAD DES 2010: Asymptomatic  Dyslipidemia: stable  Essential hypertension: stable  (HFpEF) heart failure with preserved ejection fraction: no volume overload  Sleep apnea     Current medicines are reviewed at length with the patient today.  The patient does not have concerns regarding medicines.  The following changes have been made:  no change  Labs/ tests ordered today include:  No orders of the defined types were placed in this encounter.     Disposition:   FU with HS in 1 year  Signed, Ricky NoeSMITH III,Providencia Hottenstein W, MD  12/19/2014 4:21 PM    [facility] [address], [place], [place]  [address] Phone: [phone]; Fax: [phone]

## 2015-01-20 ENCOUNTER — Other Ambulatory Visit: Payer: Self-pay | Admitting: Interventional Cardiology

## 2015-08-16 IMAGING — CR DG CHEST 2V
2 series · 2 of 2 positions shown · non-contrast
Comparison: 07/01/2011 and prior chest radiographs .

CLINICAL DATA: 47-year-old male with chest pain and shortness of
breath

EXAM:
CHEST  2 VIEW

[w chest pa]
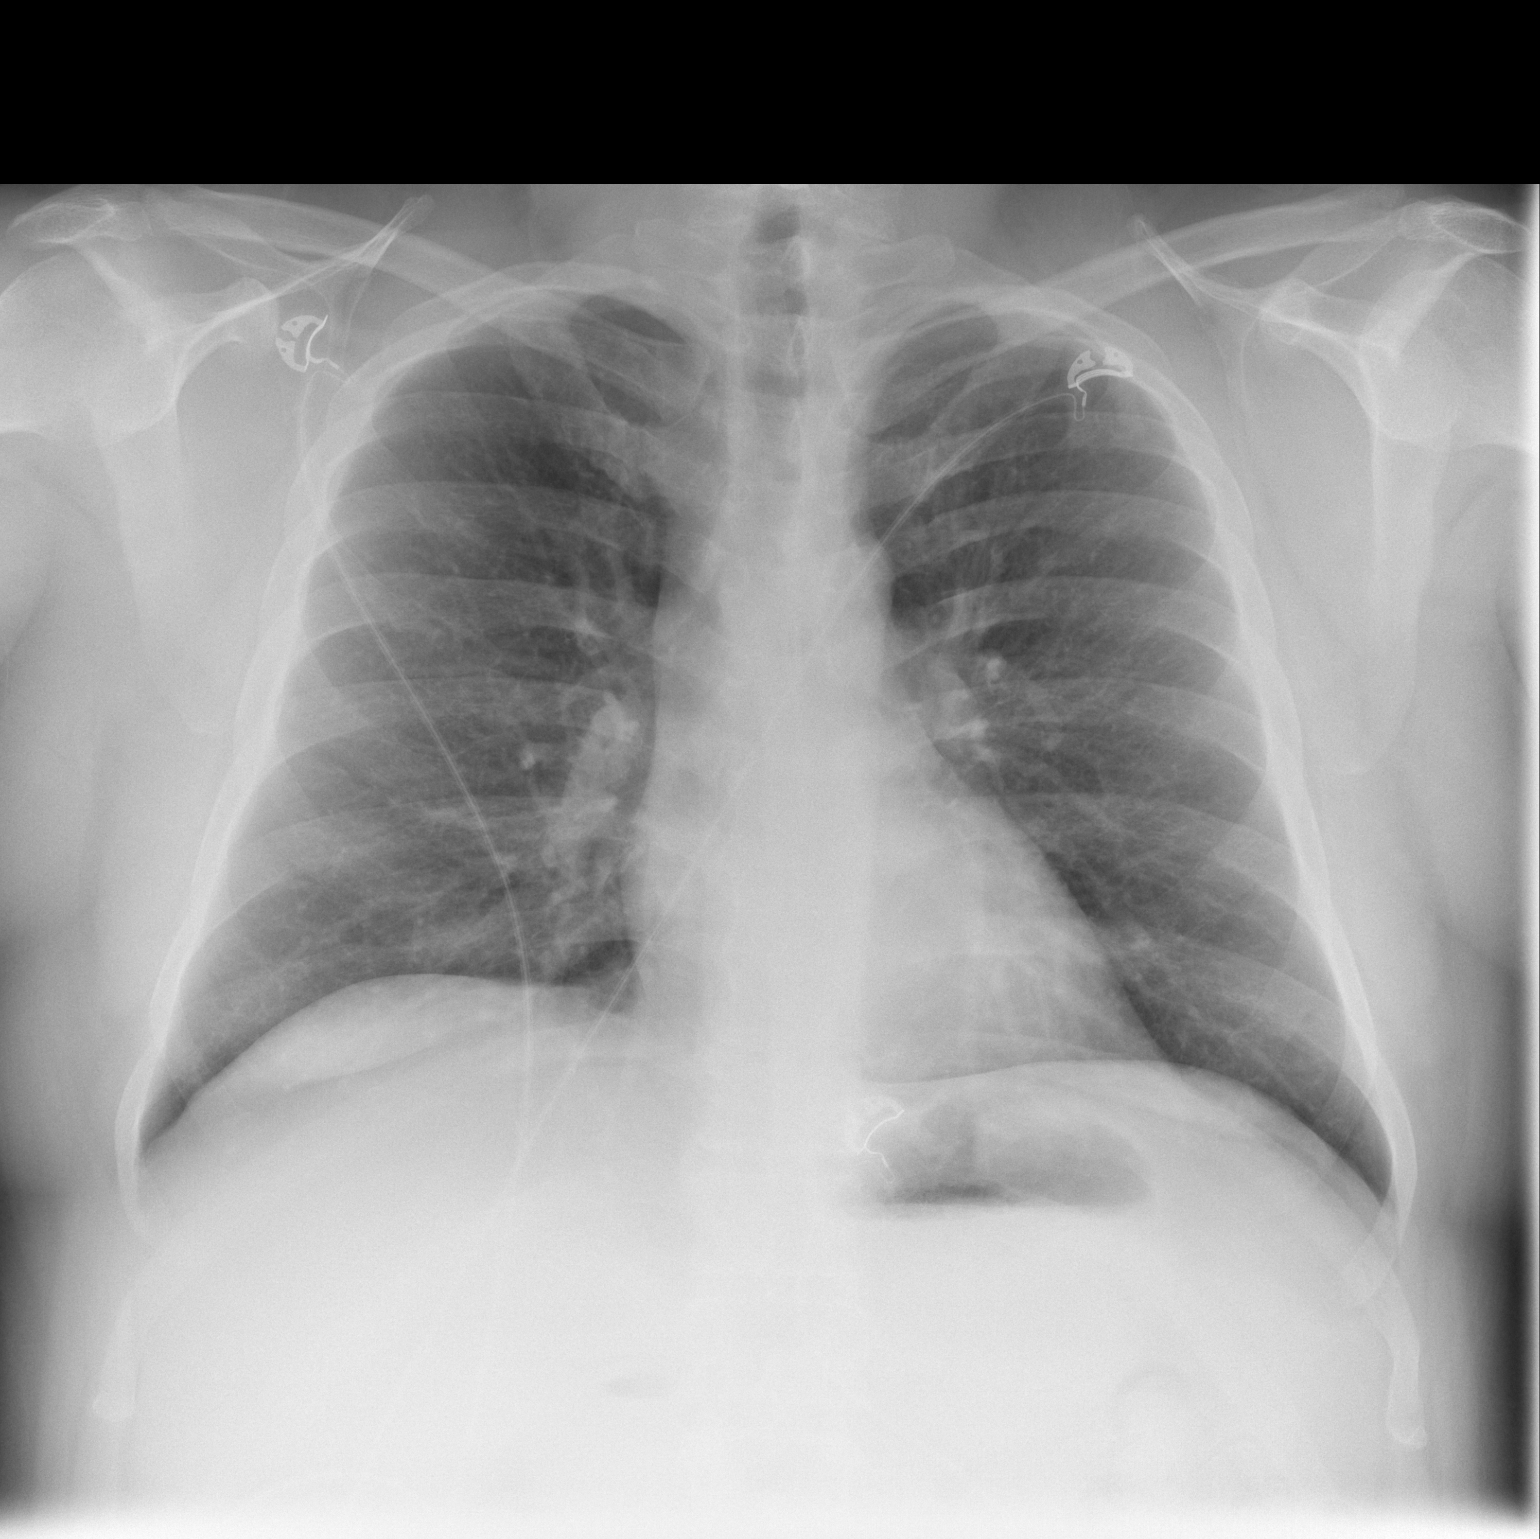

[w chest lat]
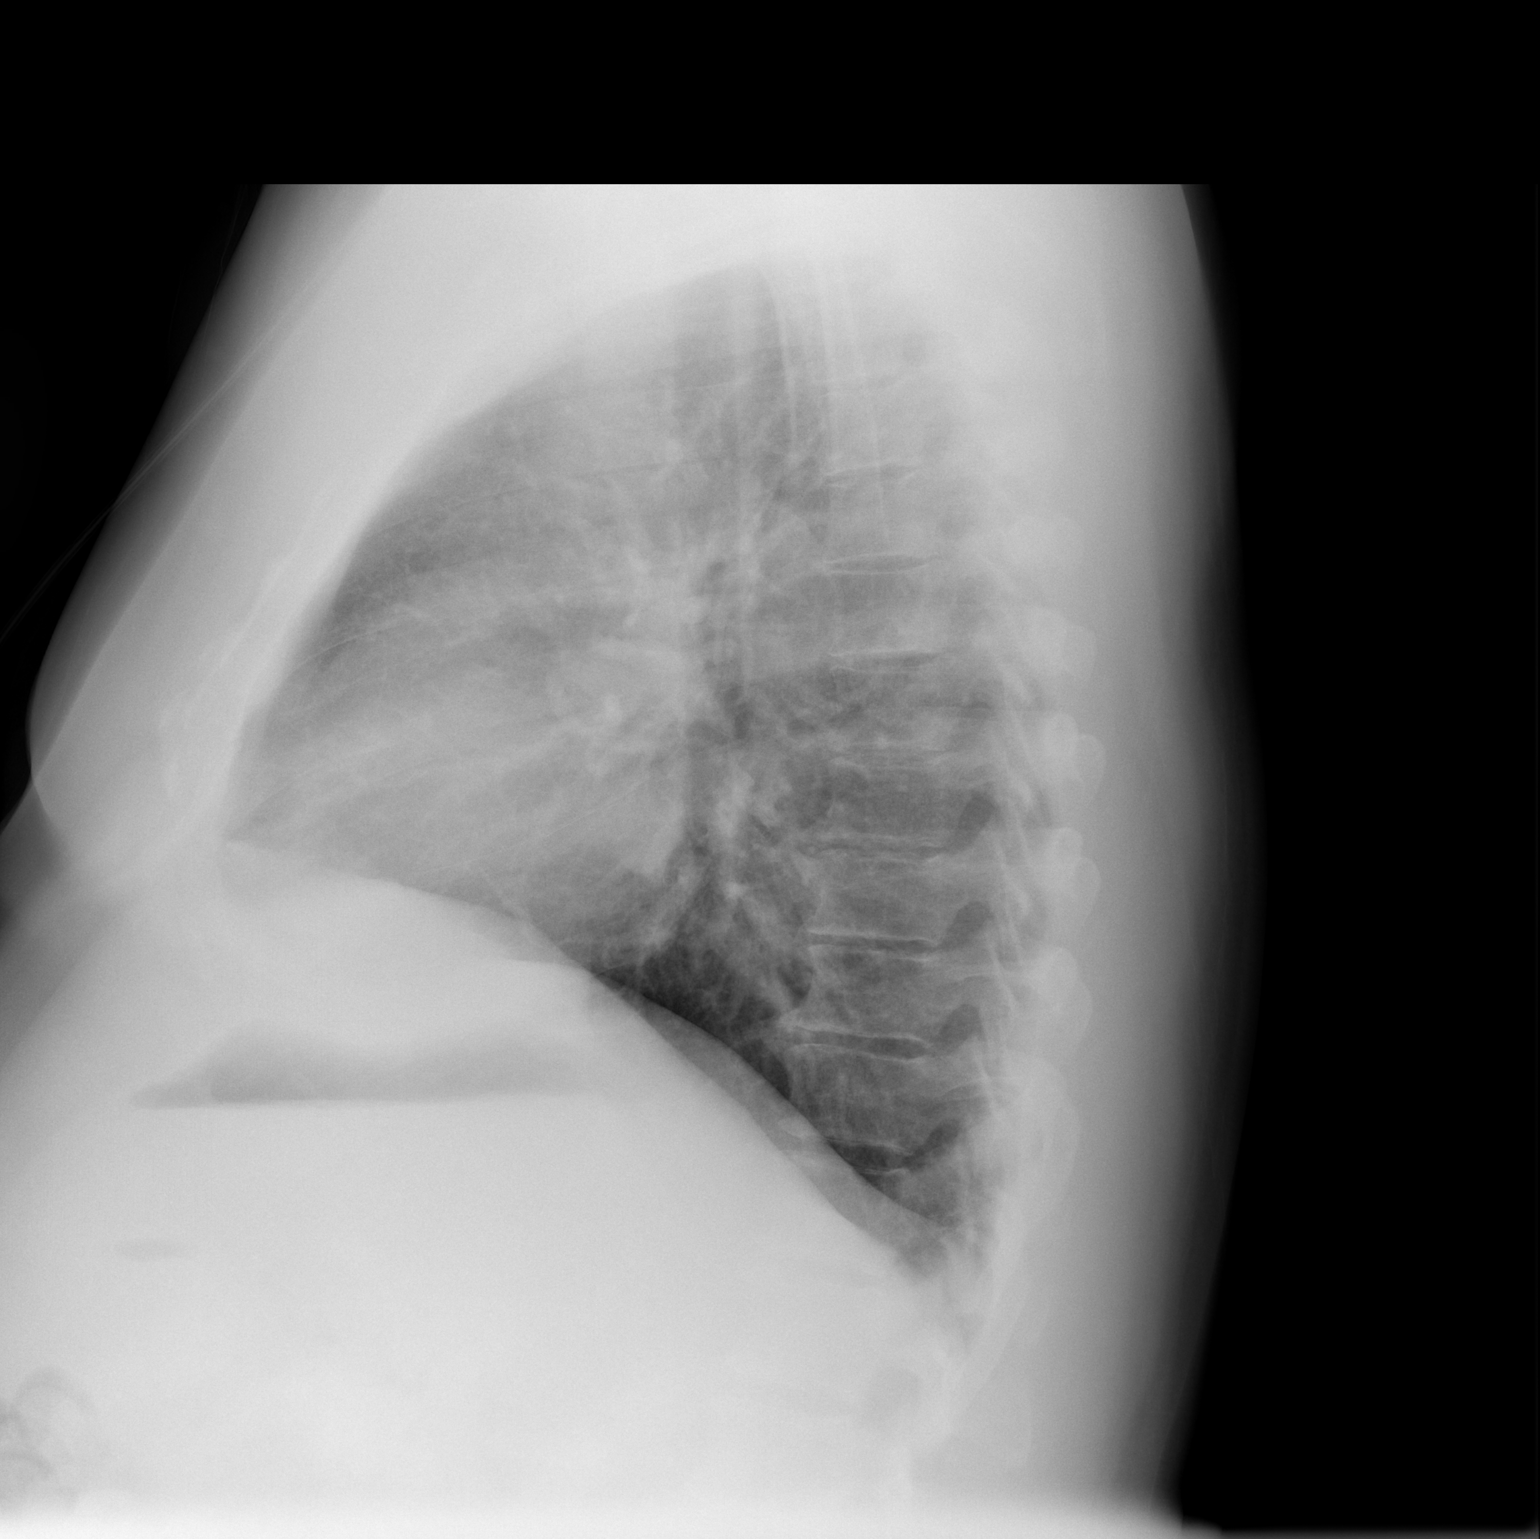

[2 of 2 positions shown; findings below may reference images not displayed]

FINDINGS: The cardiomediastinal silhouette is unremarkable.

Mild peribronchial thickening is stable.

There is no evidence of focal airspace disease, pulmonary edema,
suspicious pulmonary nodule/mass, pleural effusion, or pneumothorax.
No acute bony abnormalities are identified.

Lower cervical spinal fusion changes noted.
IMPRESSION: No active cardiopulmonary disease.

## 2016-09-24 DIAGNOSIS — I2510 Atherosclerotic heart disease of native coronary artery without angina pectoris: Secondary | ICD-10-CM | POA: Diagnosis not present

## 2016-09-24 DIAGNOSIS — F17210 Nicotine dependence, cigarettes, uncomplicated: Secondary | ICD-10-CM | POA: Diagnosis not present

## 2016-09-24 DIAGNOSIS — Z7984 Long term (current) use of oral hypoglycemic drugs: Secondary | ICD-10-CM | POA: Diagnosis not present

## 2016-09-24 DIAGNOSIS — N132 Hydronephrosis with renal and ureteral calculous obstruction: Secondary | ICD-10-CM | POA: Diagnosis not present

## 2016-09-24 DIAGNOSIS — N201 Calculus of ureter: Secondary | ICD-10-CM | POA: Diagnosis not present

## 2016-09-24 DIAGNOSIS — E119 Type 2 diabetes mellitus without complications: Secondary | ICD-10-CM | POA: Diagnosis not present

## 2016-09-24 DIAGNOSIS — I252 Old myocardial infarction: Secondary | ICD-10-CM | POA: Diagnosis not present

## 2016-09-24 DIAGNOSIS — R319 Hematuria, unspecified: Secondary | ICD-10-CM | POA: Diagnosis present

## 2016-09-24 DIAGNOSIS — Z7982 Long term (current) use of aspirin: Secondary | ICD-10-CM | POA: Diagnosis not present

## 2016-09-25 ENCOUNTER — Emergency Department (HOSPITAL_BASED_OUTPATIENT_CLINIC_OR_DEPARTMENT_OTHER): Payer: 59

## 2016-09-25 ENCOUNTER — Encounter (HOSPITAL_BASED_OUTPATIENT_CLINIC_OR_DEPARTMENT_OTHER): Payer: Self-pay | Admitting: Emergency Medicine

## 2016-09-25 ENCOUNTER — Emergency Department (HOSPITAL_BASED_OUTPATIENT_CLINIC_OR_DEPARTMENT_OTHER)
Admission: EM | Admit: 2016-09-25 | Discharge: 2016-09-25 | Disposition: A | Discharge status: Home / Self Care | Payer: 59 | Attending: Emergency Medicine | Admitting: Emergency Medicine

## 2016-09-25 DIAGNOSIS — N132 Hydronephrosis with renal and ureteral calculous obstruction: Secondary | ICD-10-CM | POA: Diagnosis not present

## 2016-09-25 DIAGNOSIS — N201 Calculus of ureter: Secondary | ICD-10-CM

## 2016-09-25 LAB — URINALYSIS, ROUTINE W REFLEX MICROSCOPIC
Bilirubin Urine: NEGATIVE
Glucose, UA: NEGATIVE mg/dL
Ketones, ur: 15 mg/dL — AB
Nitrite: NEGATIVE
Protein, ur: NEGATIVE mg/dL
Specific Gravity, Urine: 1.019 (ref 1.005–1.030)
pH: 6.5 (ref 5.0–8.0)

## 2016-09-25 LAB — URINALYSIS, MICROSCOPIC (REFLEX)

## 2016-09-25 MED ORDER — TAMSULOSIN HCL 0.4 MG PO CAPS
ORAL_CAPSULE | ORAL | 0 refills | Status: AC
Start: 2016-09-25 — End: ?

## 2016-09-25 MED ORDER — OXYCODONE-ACETAMINOPHEN 5-325 MG PO TABS: 1.0000 | ORAL_TABLET | ORAL | 0 refills | Status: AC | PRN

## 2016-09-25 MED ORDER — TAMSULOSIN HCL 0.4 MG PO CAPS
0.4000 mg | ORAL_CAPSULE | Freq: Once | ORAL | Status: AC
  Administered 2016-09-25: 0.4 mg via ORAL
  Filled 2016-09-25: qty 1

## 2016-09-25 MED ORDER — ONDANSETRON 8 MG PO TBDP
8.0000 mg | ORAL_TABLET | Freq: Three times a day (TID) | ORAL | 1 refills | Status: AC | PRN
Start: 2016-09-25 — End: ?

## 2016-09-25 NOTE — ED Provider Notes (Signed)
[facility] Provider Note: [name], MD, FACEP  CSN: [id number] MRN: [id number] ARRIVAL: 09/24/16 at 2358 ROOM: MH02/MH02   CHIEF COMPLAINT  Hematuria   HISTORY OF PRESENT ILLNESS  [name] is a 51 y.o. male with a history of nephrolithiasis. He is here with gross hematuria that began yesterday evening about 4 PM. There was no associated pain or nausea. He has never had this much bleeding with a kidney stone in the past. He began having some left flank pain after arriving in the ED. He describes his pain as about a 3 out of 10 presently. He has not required analgesics for it. He continues to have no nausea or vomiting.   Past Medical History:  Diagnosis Date  . AMI (acute myocardial infarction) 2010  . Coronary artery disease 08/2008   AWMI with DES Xince EF 40%  . Diabetes ([facility]) 10/2012   [facility]  . Hepatitis C 2002   with treatment  . History of cardiovascular stress test 10/2008 & 10/2010   Normal repeat normal EF 59% 2012  . Obesity (BMI 30-39.9)   . Obstructive sleep apnea     Past Surgical History:  Procedure Laterality Date  . CERVICAL FUSION    . CORONARY ANGIOPLASTY WITH STENT PLACEMENT  2010   Xience stent to the LAD  . HERNIA REPAIR Right   . KNEE SURGERY Right   . LEFT HEART CATHETERIZATION WITH CORONARY ANGIOGRAM N/A 04/10/2014   Procedure: LEFT HEART CATHETERIZATION WITH CORONARY ANGIOGRAM;  Surgeon: Lennette Bihari, MD;  Location: [facility] CATH LAB;  Service: Cardiovascular;  Laterality: N/A;    Family History  Problem Relation Age of Onset  . Heart disease Father   . Colon cancer Mother     Social History  Substance Use Topics  . Smoking status: Former Smoker    Years: 20.00    Types: Cigarettes    Quit date: 05/20/2013  . Smokeless tobacco: Never Used  . Alcohol use No    Prior to Admission medications   Medication Sig Start Date End Date Taking? Authorizing Provider  aspirin 325 MG tablet Take 325 mg by mouth every morning.      Historical Provider, MD  carvedilol (COREG) 6.25 MG tablet Take 6.25 mg by mouth 2 (two) times daily with a meal.      Historical Provider, MD  CRESTOR 10 MG tablet TAKE ONE TABLET BY MOUTH AT BEDTIME 01/21/15   Lyn Records, MD  Cyanocobalamin (VITAMIN B-12 PO) Take 1 tablet by mouth daily.    Historical Provider, MD  metFORMIN (GLUCOPHAGE) 500 MG tablet Take 1 tablet (500 mg total) by mouth 2 (two) times daily with a meal. HOLD for 2 days, restart on 04/13/2014 04/10/14   Joline Salt Barrett, PA-C  nitroGLYCERIN (NITROSTAT) 0.4 MG SL tablet Place 0.4 mg under the tongue every 5 (five) minutes as needed for chest pain.     Historical Provider, MD  ondansetron (ZOFRAN ODT) 8 MG disintegrating tablet Take 1 tablet (8 mg total) by mouth every 8 (eight) hours as needed for nausea or vomiting. 09/25/16   Paula Libra, MD  oxyCODONE-acetaminophen (PERCOCET) 5-325 MG tablet Take 1 tablet by mouth every 4 (four) hours as needed (for pain). 09/25/16   Landi Biscardi, MD  ramipril (ALTACE) 2.5 MG tablet Take 2.5 mg by mouth at bedtime.      Historical Provider, MD  tamsulosin (FLOMAX) 0.4 MG CAPS capsule Take one capsule daily until stone passes. 09/25/16  Paula LibraJohn Azaliyah Kennard, MD  traZODone (DESYREL) 50 MG tablet Take 50 mg by mouth at bedtime.    Historical Provider, MD  VITAMIN D, CHOLECALCIFEROL, PO Take 1 tablet by mouth daily.      Historical Provider, MD  vitamin E 1000 UNIT capsule Take 1,000 Units by mouth daily.    Historical Provider, MD    Allergies Latex and Sulfa antibiotics   REVIEW OF SYSTEMS  Negative except as noted here or in the History of Present Illness.   PHYSICAL EXAMINATION  Initial Vital Signs Blood pressure 130/80, pulse 84, temperature 98.1 F (36.7 C), temperature source Oral, resp. rate 18, height 5\' 6"  (1.676 m), weight 240 lb (108.9 kg), SpO2 97 %.  Examination General: Well-developed, well-nourished male in no acute distress; appearance consistent with age of record HENT:  normocephalic; atraumatic Eyes: pupils equal, round and reactive to light; extraocular muscles intact Neck: supple Heart: regular rate and rhythm Lungs: clear to auscultation bilaterally Abdomen: soft; nondistended; nontender; no masses or hepatosplenomegaly; bowel sounds present GU: No CVA tenderness Extremities: No deformity; full range of motion; pulses normal Neurologic: Awake, alert and oriented; motor function intact in all extremities and symmetric; no facial droop Skin: Warm and dry Psychiatric: Normal mood and affect   RESULTS  Summary of this visit's results, reviewed by myself:   EKG Interpretation  Date/Time:    Ventricular Rate:    PR Interval:    QRS Duration:   QT Interval:    QTC Calculation:   R Axis:     Text Interpretation:        Laboratory Studies: Results for orders placed or performed during the hospital encounter of 09/25/16 (from the past 24 hour(s))  Urinalysis, Routine w reflex microscopic- may I&O cath if menses     Status: Abnormal   Collection Time: 09/25/16 12:27 AM  Result Value Ref Range   Color, Urine RED (A) YELLOW   APPearance CLOUDY (A) CLEAR   Specific Gravity, Urine 1.019 1.005 - 1.030   pH 6.5 5.0 - 8.0   Glucose, UA NEGATIVE NEGATIVE mg/dL   Hgb urine dipstick LARGE (A) NEGATIVE   Bilirubin Urine NEGATIVE NEGATIVE   Ketones, ur 15 (A) NEGATIVE mg/dL   Protein, ur NEGATIVE NEGATIVE mg/dL   Nitrite NEGATIVE NEGATIVE   Leukocytes, UA SMALL (A) NEGATIVE  Urinalysis, Microscopic (reflex)     Status: Abnormal   Collection Time: 09/25/16 12:27 AM  Result Value Ref Range   RBC / HPF TOO NUMEROUS TO COUNT 0 - 5 RBC/hpf   WBC, UA 0-5 0 - 5 WBC/hpf   Bacteria, UA RARE (A) NONE SEEN   Squamous Epithelial / LPF 0-5 (A) NONE SEEN   Imaging Studies: Ct Renal Stone Study  Result Date: 09/25/2016 CLINICAL DATA:  Bilateral back pain onset yesterday with hematuria. EXAM: CT ABDOMEN AND PELVIS WITHOUT CONTRAST TECHNIQUE: Multidetector CT  imaging of the abdomen and pelvis was performed following the standard protocol without IV contrast. COMPARISON:  None. FINDINGS: LOWER CHEST: Lung bases are clear. Included heart size is normal. No pericardial effusion. HEPATOBILIARY: Liver and gallbladder are normal. PANCREAS: Normal. SPLEEN: Normal. ADRENALS/URINARY TRACT: Kidneys are orthotopic. There is a 3 x 5 mm left UPJ stone causing mild left-sided hydronephrosis and proximal hydroureter. Urinary bladder is partially distended and unremarkable. Normal adrenal glands. STOMACH/BOWEL: The stomach, small and large bowel are normal in course and caliber without inflammatory changes. Normal appendix. VASCULAR/LYMPHATIC: Aortoiliac vessels are normal in course and caliber. No lymphadenopathy by CT size  criteria. REPRODUCTIVE: Normal. OTHER: No intraperitoneal free fluid or free air. MUSCULOSKELETAL: Nonacute. IMPRESSION: There is a 3 x 5 mm left UPJ stone causing mild left-sided hydronephrosis and proximal hydroureter. Electronically Signed   By: Tollie Eth M.D.   On: 09/25/2016 02:35    ED COURSE  Nursing notes and initial vitals signs, including pulse oximetry, reviewed.  Vitals:   09/25/16 0007  BP: 130/80  Pulse: 84  Resp: 18  Temp: 98.1 F (36.7 C)  TempSrc: Oral  SpO2: 97%  Weight: 240 lb (108.9 kg)  Height: 5\' 6"  (1.676 m)    PROCEDURES    ED DIAGNOSES     ICD-9-CM ICD-10-CM   1. Ureterolithiasis 592.1 N20.1        Paula Libra, MD 09/25/16 [phone]

## 2016-09-25 NOTE — ED Notes (Signed)
C/o blood in urine and bilateral back pain onset around 1600 last pm

## 2016-09-25 NOTE — ED Triage Notes (Signed)
Patient states that he has had blood to his urine since 4 pm. Reports a small amount of pain when he urinates

## 2016-10-08 DIAGNOSIS — N132 Hydronephrosis with renal and ureteral calculous obstruction: Secondary | ICD-10-CM | POA: Diagnosis not present

## 2016-10-08 DIAGNOSIS — N201 Calculus of ureter: Secondary | ICD-10-CM | POA: Diagnosis not present

## 2016-10-26 DIAGNOSIS — N201 Calculus of ureter: Secondary | ICD-10-CM | POA: Diagnosis not present

## 2016-11-19 DIAGNOSIS — G4733 Obstructive sleep apnea (adult) (pediatric): Secondary | ICD-10-CM | POA: Diagnosis not present

## 2016-11-25 DIAGNOSIS — E7800 Pure hypercholesterolemia, unspecified: Secondary | ICD-10-CM | POA: Diagnosis not present

## 2016-11-25 DIAGNOSIS — E1159 Type 2 diabetes mellitus with other circulatory complications: Secondary | ICD-10-CM | POA: Diagnosis not present

## 2016-11-25 DIAGNOSIS — Z0000 Encounter for general adult medical examination without abnormal findings: Secondary | ICD-10-CM | POA: Diagnosis not present

## 2016-11-25 DIAGNOSIS — Z125 Encounter for screening for malignant neoplasm of prostate: Secondary | ICD-10-CM | POA: Diagnosis not present

## 2016-11-25 DIAGNOSIS — I10 Essential (primary) hypertension: Secondary | ICD-10-CM | POA: Diagnosis not present

## 2016-12-03 ENCOUNTER — Encounter: Payer: Self-pay | Admitting: *Deleted

## 2016-12-04 ENCOUNTER — Encounter: Payer: Self-pay | Admitting: *Deleted

## 2016-12-07 ENCOUNTER — Ambulatory Visit: Payer: 59 | Admitting: Diagnostic Neuroimaging

## 2017-01-22 DIAGNOSIS — G4733 Obstructive sleep apnea (adult) (pediatric): Secondary | ICD-10-CM | POA: Diagnosis not present

## 2017-01-22 DIAGNOSIS — I10 Essential (primary) hypertension: Secondary | ICD-10-CM | POA: Diagnosis not present

## 2017-02-24 DIAGNOSIS — G4733 Obstructive sleep apnea (adult) (pediatric): Secondary | ICD-10-CM | POA: Diagnosis not present

## 2017-02-24 DIAGNOSIS — I10 Essential (primary) hypertension: Secondary | ICD-10-CM | POA: Diagnosis not present

## 2017-03-26 DIAGNOSIS — G4733 Obstructive sleep apnea (adult) (pediatric): Secondary | ICD-10-CM | POA: Diagnosis not present

## 2017-03-26 DIAGNOSIS — I10 Essential (primary) hypertension: Secondary | ICD-10-CM | POA: Diagnosis not present

## 2017-04-27 DIAGNOSIS — I10 Essential (primary) hypertension: Secondary | ICD-10-CM | POA: Diagnosis not present

## 2017-04-27 DIAGNOSIS — G4733 Obstructive sleep apnea (adult) (pediatric): Secondary | ICD-10-CM | POA: Diagnosis not present

## 2017-05-27 DIAGNOSIS — G4733 Obstructive sleep apnea (adult) (pediatric): Secondary | ICD-10-CM | POA: Diagnosis not present

## 2017-05-27 DIAGNOSIS — I10 Essential (primary) hypertension: Secondary | ICD-10-CM | POA: Diagnosis not present

## 2017-06-23 ENCOUNTER — Telehealth: Payer: Self-pay

## 2017-06-23 DIAGNOSIS — E7800 Pure hypercholesterolemia, unspecified: Secondary | ICD-10-CM | POA: Diagnosis not present

## 2017-06-23 DIAGNOSIS — E119 Type 2 diabetes mellitus without complications: Secondary | ICD-10-CM | POA: Diagnosis not present

## 2017-06-23 DIAGNOSIS — I10 Essential (primary) hypertension: Secondary | ICD-10-CM | POA: Diagnosis not present

## 2017-06-23 NOTE — Telephone Encounter (Signed)
SENT NOTES TO SCHEDULING 

## 2017-06-26 DIAGNOSIS — G4733 Obstructive sleep apnea (adult) (pediatric): Secondary | ICD-10-CM | POA: Diagnosis not present

## 2017-06-26 DIAGNOSIS — I10 Essential (primary) hypertension: Secondary | ICD-10-CM | POA: Diagnosis not present

## 2017-07-26 DIAGNOSIS — H35361 Drusen (degenerative) of macula, right eye: Secondary | ICD-10-CM | POA: Diagnosis not present

## 2017-07-26 DIAGNOSIS — E119 Type 2 diabetes mellitus without complications: Secondary | ICD-10-CM | POA: Diagnosis not present

## 2017-07-26 DIAGNOSIS — I10 Essential (primary) hypertension: Secondary | ICD-10-CM | POA: Diagnosis not present

## 2017-07-26 DIAGNOSIS — G4733 Obstructive sleep apnea (adult) (pediatric): Secondary | ICD-10-CM | POA: Diagnosis not present

## 2017-08-03 ENCOUNTER — Ambulatory Visit: Payer: BLUE CROSS/BLUE SHIELD | Admitting: Nurse Practitioner

## 2017-08-17 DIAGNOSIS — M5441 Lumbago with sciatica, right side: Secondary | ICD-10-CM | POA: Diagnosis not present

## 2017-08-17 DIAGNOSIS — M5414 Radiculopathy, thoracic region: Secondary | ICD-10-CM | POA: Diagnosis not present

## 2017-08-25 DIAGNOSIS — G4733 Obstructive sleep apnea (adult) (pediatric): Secondary | ICD-10-CM | POA: Diagnosis not present

## 2017-08-25 DIAGNOSIS — I10 Essential (primary) hypertension: Secondary | ICD-10-CM | POA: Diagnosis not present

## 2017-09-06 DIAGNOSIS — R05 Cough: Secondary | ICD-10-CM | POA: Diagnosis not present

## 2017-09-14 DIAGNOSIS — M5441 Lumbago with sciatica, right side: Secondary | ICD-10-CM | POA: Diagnosis not present

## 2017-09-14 DIAGNOSIS — M5126 Other intervertebral disc displacement, lumbar region: Secondary | ICD-10-CM | POA: Diagnosis not present

## 2017-09-24 DIAGNOSIS — I10 Essential (primary) hypertension: Secondary | ICD-10-CM | POA: Diagnosis not present

## 2017-09-24 DIAGNOSIS — G4733 Obstructive sleep apnea (adult) (pediatric): Secondary | ICD-10-CM | POA: Diagnosis not present

## 2017-09-29 DIAGNOSIS — M5414 Radiculopathy, thoracic region: Secondary | ICD-10-CM | POA: Diagnosis not present

## 2017-10-25 DIAGNOSIS — G4733 Obstructive sleep apnea (adult) (pediatric): Secondary | ICD-10-CM | POA: Diagnosis not present

## 2017-10-25 DIAGNOSIS — I10 Essential (primary) hypertension: Secondary | ICD-10-CM | POA: Diagnosis not present

## 2017-11-24 DIAGNOSIS — I10 Essential (primary) hypertension: Secondary | ICD-10-CM | POA: Diagnosis not present

## 2017-11-24 DIAGNOSIS — G4733 Obstructive sleep apnea (adult) (pediatric): Secondary | ICD-10-CM | POA: Diagnosis not present

## 2017-12-01 DIAGNOSIS — E1159 Type 2 diabetes mellitus with other circulatory complications: Secondary | ICD-10-CM | POA: Diagnosis not present

## 2017-12-01 DIAGNOSIS — E7800 Pure hypercholesterolemia, unspecified: Secondary | ICD-10-CM | POA: Diagnosis not present

## 2017-12-01 DIAGNOSIS — I10 Essential (primary) hypertension: Secondary | ICD-10-CM | POA: Diagnosis not present

## 2017-12-01 DIAGNOSIS — Z0000 Encounter for general adult medical examination without abnormal findings: Secondary | ICD-10-CM | POA: Diagnosis not present

## 2017-12-20 DIAGNOSIS — E1169 Type 2 diabetes mellitus with other specified complication: Secondary | ICD-10-CM | POA: Diagnosis not present

## 2017-12-20 DIAGNOSIS — G4733 Obstructive sleep apnea (adult) (pediatric): Secondary | ICD-10-CM | POA: Diagnosis not present

## 2017-12-23 DIAGNOSIS — I10 Essential (primary) hypertension: Secondary | ICD-10-CM | POA: Diagnosis not present

## 2017-12-23 DIAGNOSIS — E119 Type 2 diabetes mellitus without complications: Secondary | ICD-10-CM | POA: Diagnosis not present

## 2017-12-23 DIAGNOSIS — I2510 Atherosclerotic heart disease of native coronary artery without angina pectoris: Secondary | ICD-10-CM | POA: Diagnosis not present

## 2017-12-24 DIAGNOSIS — I10 Essential (primary) hypertension: Secondary | ICD-10-CM | POA: Diagnosis not present

## 2017-12-24 DIAGNOSIS — G4733 Obstructive sleep apnea (adult) (pediatric): Secondary | ICD-10-CM | POA: Diagnosis not present

## 2018-01-25 DIAGNOSIS — G4733 Obstructive sleep apnea (adult) (pediatric): Secondary | ICD-10-CM | POA: Diagnosis not present

## 2018-01-25 DIAGNOSIS — I10 Essential (primary) hypertension: Secondary | ICD-10-CM | POA: Diagnosis not present

## 2018-02-24 DIAGNOSIS — I10 Essential (primary) hypertension: Secondary | ICD-10-CM | POA: Diagnosis not present

## 2018-02-24 DIAGNOSIS — G4733 Obstructive sleep apnea (adult) (pediatric): Secondary | ICD-10-CM | POA: Diagnosis not present

## 2018-03-26 DIAGNOSIS — G4733 Obstructive sleep apnea (adult) (pediatric): Secondary | ICD-10-CM | POA: Diagnosis not present

## 2018-03-26 DIAGNOSIS — I10 Essential (primary) hypertension: Secondary | ICD-10-CM | POA: Diagnosis not present

## 2018-04-25 DIAGNOSIS — G4733 Obstructive sleep apnea (adult) (pediatric): Secondary | ICD-10-CM | POA: Diagnosis not present

## 2018-04-25 DIAGNOSIS — I10 Essential (primary) hypertension: Secondary | ICD-10-CM | POA: Diagnosis not present

## 2018-05-25 DIAGNOSIS — G4733 Obstructive sleep apnea (adult) (pediatric): Secondary | ICD-10-CM | POA: Diagnosis not present

## 2018-05-25 DIAGNOSIS — I10 Essential (primary) hypertension: Secondary | ICD-10-CM | POA: Diagnosis not present

## 2018-06-06 DIAGNOSIS — G4733 Obstructive sleep apnea (adult) (pediatric): Secondary | ICD-10-CM | POA: Diagnosis not present

## 2018-06-06 DIAGNOSIS — I2510 Atherosclerotic heart disease of native coronary artery without angina pectoris: Secondary | ICD-10-CM | POA: Diagnosis not present

## 2018-06-06 DIAGNOSIS — E1169 Type 2 diabetes mellitus with other specified complication: Secondary | ICD-10-CM | POA: Diagnosis not present

## 2018-06-24 DIAGNOSIS — I10 Essential (primary) hypertension: Secondary | ICD-10-CM | POA: Diagnosis not present

## 2018-06-24 DIAGNOSIS — G4733 Obstructive sleep apnea (adult) (pediatric): Secondary | ICD-10-CM | POA: Diagnosis not present

## 2018-07-25 DIAGNOSIS — I10 Essential (primary) hypertension: Secondary | ICD-10-CM | POA: Diagnosis not present

## 2018-07-25 DIAGNOSIS — G4733 Obstructive sleep apnea (adult) (pediatric): Secondary | ICD-10-CM | POA: Diagnosis not present

## 2018-08-26 DIAGNOSIS — G4733 Obstructive sleep apnea (adult) (pediatric): Secondary | ICD-10-CM | POA: Diagnosis not present

## 2018-08-26 DIAGNOSIS — I10 Essential (primary) hypertension: Secondary | ICD-10-CM | POA: Diagnosis not present

## 2018-09-26 DIAGNOSIS — I10 Essential (primary) hypertension: Secondary | ICD-10-CM | POA: Diagnosis not present

## 2018-09-26 DIAGNOSIS — G4733 Obstructive sleep apnea (adult) (pediatric): Secondary | ICD-10-CM | POA: Diagnosis not present

## 2018-10-18 ENCOUNTER — Emergency Department (HOSPITAL_COMMUNITY)
Admission: EM | Admit: 2018-10-18 | Discharge: 2018-10-18 | Disposition: A | Discharge status: Home / Self Care | Payer: 59 | Attending: Emergency Medicine | Admitting: Emergency Medicine

## 2018-10-18 ENCOUNTER — Encounter (HOSPITAL_COMMUNITY): Payer: Self-pay

## 2018-10-18 ENCOUNTER — Emergency Department (HOSPITAL_COMMUNITY): Payer: 59

## 2018-10-18 DIAGNOSIS — R0789 Other chest pain: Secondary | ICD-10-CM | POA: Diagnosis not present

## 2018-10-18 DIAGNOSIS — Z79899 Other long term (current) drug therapy: Secondary | ICD-10-CM | POA: Diagnosis not present

## 2018-10-18 DIAGNOSIS — I10 Essential (primary) hypertension: Secondary | ICD-10-CM | POA: Diagnosis not present

## 2018-10-18 DIAGNOSIS — Z7984 Long term (current) use of oral hypoglycemic drugs: Secondary | ICD-10-CM | POA: Diagnosis not present

## 2018-10-18 DIAGNOSIS — Z87891 Personal history of nicotine dependence: Secondary | ICD-10-CM | POA: Insufficient documentation

## 2018-10-18 DIAGNOSIS — Z7982 Long term (current) use of aspirin: Secondary | ICD-10-CM | POA: Insufficient documentation

## 2018-10-18 DIAGNOSIS — R072 Precordial pain: Secondary | ICD-10-CM | POA: Diagnosis not present

## 2018-10-18 DIAGNOSIS — R079 Chest pain, unspecified: Secondary | ICD-10-CM | POA: Diagnosis present

## 2018-10-18 DIAGNOSIS — E119 Type 2 diabetes mellitus without complications: Secondary | ICD-10-CM | POA: Diagnosis not present

## 2018-10-18 LAB — BASIC METABOLIC PANEL
Anion gap: 8 (ref 5–15)
BUN: 15 mg/dL (ref 6–20)
CO2: 25 mmol/L (ref 22–32)
Calcium: 9.3 mg/dL (ref 8.9–10.3)
Chloride: 108 mmol/L (ref 98–111)
Creatinine, Ser: 1.18 mg/dL (ref 0.61–1.24)
GFR calc Af Amer: >60 mL/min (ref >60)
GFR calc non Af Amer: >60 mL/min (ref >60)
Glucose, Bld: 176 mg/dL — ABNORMAL HIGH (ref 70–99)
Potassium: 4.5 mmol/L (ref 3.5–5.1)
Sodium: 141 mmol/L (ref 135–145)

## 2018-10-18 LAB — CBC
HCT: 45.3 % (ref 39.0–52.0)
Hemoglobin: 14.7 g/dL (ref 13.0–17.0)
MCH: 30.8 pg (ref 26.0–34.0)
MCHC: 32.5 g/dL (ref 30.0–36.0)
MCV: 94.8 fL (ref 80.0–100.0)
Platelets: 198 10*3/uL (ref 150–400)
RBC: 4.78 MIL/uL (ref 4.22–5.81)
RDW: 13.2 % (ref 11.5–15.5)
WBC: 8.6 10*3/uL (ref 4.0–10.5)
nRBC: 0 % (ref 0.0–0.2)

## 2018-10-18 LAB — I-STAT TROPONIN, ED
Troponin i, poc: 0 ng/mL (ref 0.00–0.08)
Troponin i, poc: 0 ng/mL (ref 0.00–0.08)

## 2018-10-18 MED ORDER — IBUPROFEN 200 MG PO TABS
600.0000 mg | ORAL_TABLET | Freq: Once | ORAL | Status: AC
  Administered 2018-10-18: 600 mg via ORAL
  Filled 2018-10-18: qty 1

## 2018-10-18 MED ORDER — SODIUM CHLORIDE 0.9% FLUSH: 3.0000 mL | Freq: Once | INTRAVENOUS | Status: DC

## 2018-10-18 MED ORDER — ACETAMINOPHEN 500 MG PO TABS
1000.0000 mg | ORAL_TABLET | Freq: Once | ORAL | Status: AC
  Administered 2018-10-18: 1000 mg via ORAL
  Filled 2018-10-18: qty 2

## 2018-10-18 NOTE — ED Provider Notes (Addendum)
[facility] EMERGENCY DEPARTMENT Provider Note   CSN: [id number] Arrival date & time: 10/18/18  1201    History   Chief Complaint Chief Complaint  Patient presents with  . Chest Pain    HPI [name] is a 53 y.o. male.     Patient with hx cad, dm, presents c/o mid chest pain for past week. Symptoms occur at rest, intermittent, persistent, recurrent, constant in past day. States pain is dull to sharp, midline/mid chest, also notes in right axillary area, and groin/inguinal area. No heartburn/indigestion. No constant and/or pleuritic pain. No leg pain or swelling.no immobility, trauma, travel or surgery.  No hx dvt or pe. Hx stent 2010, subsequent cath 5 yrs ago in setting recurrent chest pain demonstrated patent stent, no significant obstructive disease. Pt denies cough or uri symptoms. No fever or chills. +smoker.   The history is provided by the patient.  Chest Pain  Associated symptoms: no abdominal pain, no back pain, no cough, no fever, no headache, no palpitations, no shortness of breath and no vomiting     Past Medical History:  Diagnosis Date  . AMI (acute myocardial infarction) (HCC) 2010  . Coronary artery disease 08/2008   AWMI with DES Xince EF 40%  . Diabetes (HCC) 10/2012   [facility]  . Hepatitis C 2002   with treatment  . History of cardiovascular stress test 10/2008 & 10/2010   Normal repeat normal EF 59% 2012  . Obesity (BMI 30-39.9)   . Obstructive sleep apnea     Patient Active Problem List   Diagnosis Date Noted  . Dyslipidemia 12/11/2013  . Sleep apnea 06/13/2013  . CAD S/P percutaneous coronary angioplasty- LAD DES 2010 06/13/2013  . Hepatitis C- treated 2002 06/13/2013  . HTN (hypertension) 06/13/2013  . (HFpEF) heart failure with preserved ejection fraction (HCC) 06/13/2013  . Obesity (BMI 30-39.9) 06/13/2013  . Type II or unspecified type diabetes mellitus without mention of complication, not stated as uncontrolled 06/13/2013     Past Surgical History:  Procedure Laterality Date  . CERVICAL FUSION    . CORONARY ANGIOPLASTY WITH STENT PLACEMENT  2010   Xience stent to the LAD  . HERNIA REPAIR Right   . KNEE SURGERY Right   . LEFT HEART CATHETERIZATION WITH CORONARY ANGIOGRAM N/A 04/10/2014   Procedure: LEFT HEART CATHETERIZATION WITH CORONARY ANGIOGRAM;  Surgeon: Lennette Bihari, MD;  Location: [facility] CATH LAB;  Service: Cardiovascular;  Laterality: N/A;        Home Medications    Prior to Admission medications   Medication Sig Start Date End Date Taking? Authorizing Provider  aspirin 325 MG tablet Take 325 mg by mouth every morning.     [provider]  carvedilol (COREG) 6.25 MG tablet Take 6.25 mg by mouth 2 (two) times daily with a meal.      [provider]  CRESTOR 10 MG tablet TAKE ONE TABLET BY MOUTH AT BEDTIME 01/21/15   Lyn Records, MD  Cyanocobalamin (VITAMIN B-12 PO) Take 1 tablet by mouth daily.    [provider]  metFORMIN (GLUCOPHAGE) 500 MG tablet Take 1 tablet (500 mg total) by mouth 2 (two) times daily with a meal. HOLD for 2 days, restart on 04/13/2014 04/10/14   Barrett, Joline Salt, PA-C  nitroGLYCERIN (NITROSTAT) 0.4 MG SL tablet Place 0.4 mg under the tongue every 5 (five) minutes as needed for chest pain.     [provider]  ondansetron (ZOFRAN ODT) 8  MG disintegrating tablet Take 1 tablet (8 mg total) by mouth every 8 (eight) hours as needed for nausea or vomiting. 09/25/16   Molpus, Jonny Ruiz, MD  oxyCODONE-acetaminophen (PERCOCET) 5-325 MG tablet Take 1 tablet by mouth every 4 (four) hours as needed (for pain). 09/25/16   Molpus, John, MD  ramipril (ALTACE) 2.5 MG tablet Take 2.5 mg by mouth at bedtime.      [provider]  tamsulosin (FLOMAX) 0.4 MG CAPS capsule Take one capsule daily until stone passes. 09/25/16   Molpus, John, MD  traZODone (DESYREL) 50 MG tablet Take 50 mg by mouth at bedtime.    [provider]  VITAMIN D,  CHOLECALCIFEROL, PO Take 1 tablet by mouth daily.      [provider]  vitamin E 1000 UNIT capsule Take 1,000 Units by mouth daily.    [provider]    Family History Family History  Problem Relation Age of Onset  . Heart disease Father   . Cancer - Lung Father   . Heart failure Father   . Colon cancer Mother   . Hypertension Mother   . CAD Mother   . CAD Brother   . CAD Maternal Grandmother   . CAD Maternal Grandfather   . Cancer Paternal Grandmother        stomach  . CAD Paternal Grandfather   . Diabetes Brother   . Hypothyroidism Sister     Social History Social History   Tobacco Use  . Smoking status: Former Smoker    Years: 20.00    Types: Cigarettes    Last attempt to quit: 05/20/2013    Years since quitting: 5.4  . Smokeless tobacco: Never Used  Substance Use Topics  . Alcohol use: No  . Drug use: No     Allergies   Latex; Sulfamethoxazole-trimethoprim; and Sulfa antibiotics   Review of Systems Review of Systems  Constitutional: Negative for fever.  HENT: Negative for sore throat.   Eyes: Negative for redness.  Respiratory: Negative for cough and shortness of breath.   Cardiovascular: Positive for chest pain. Negative for palpitations and leg swelling.  Gastrointestinal: Negative for abdominal pain and vomiting.  Genitourinary: Negative for flank pain.       Right groin pain, intermittent, sharp. No scrotal or testicular pain or swelling. Remote hx rihr.   Musculoskeletal: Negative for back pain and neck pain.  Skin: Negative for rash.  Neurological: Negative for headaches.  Hematological: Does not bruise/bleed easily.  Psychiatric/Behavioral: Negative for confusion.     Physical Exam Updated Vital Signs BP 121/79   Pulse 66   Temp 98.6 F (37 C) (Oral)   Resp 18   Ht 1.676 m ( )   Wt 108.9 kg   SpO2 95%   BMI 38.74 kg/m   Physical Exam Vitals signs and nursing note reviewed.  Constitutional:      Appearance:  Normal appearance. He is well-developed.  HENT:     Head: Atraumatic.     Nose: Nose normal.     Mouth/Throat:     Mouth: Mucous membranes are moist.     Pharynx: Oropharynx is clear.  Eyes:     General: No scleral icterus.    Conjunctiva/sclera: Conjunctivae normal.  Neck:     Musculoskeletal: Normal range of motion and neck supple. No neck rigidity.     Trachea: No tracheal deviation.  Cardiovascular:     Rate and Rhythm: Normal rate and regular rhythm.     Pulses:  Normal pulses.     Heart sounds: Normal heart sounds. No murmur. No friction rub. No gallop.   Pulmonary:     Effort: Pulmonary effort is normal. No accessory muscle usage or respiratory distress.     Breath sounds: Normal breath sounds.  Chest:     Chest wall: Tenderness present.  Abdominal:     General: Bowel sounds are normal. There is no distension.     Palpations: Abdomen is soft.     Tenderness: There is no abdominal tenderness. There is no guarding.  Genitourinary:    Comments: No cva tenderness. Musculoskeletal:        General: No swelling or tenderness.     Right lower leg: No edema.     Left lower leg: No edema.  Skin:    General: Skin is warm and dry.     Findings: No rash.  Neurological:     Mental Status: He is alert.     Comments: Alert, speech clear.   Psychiatric:        Mood and Affect: Mood normal.      ED Treatments / Results  Labs (all labs ordered are listed, but only abnormal results are displayed) Results for orders placed or performed during the hospital encounter of 10/18/18  Basic metabolic panel  Result Value Ref Range   Sodium 141 135 - 145 mmol/L   Potassium 4.5 3.5 - 5.1 mmol/L   Chloride 108 98 - 111 mmol/L   CO2 25 22 - 32 mmol/L   Glucose, Bld 176 (H) 70 - 99 mg/dL   BUN 15 6 - 20 mg/dL   Creatinine, Ser 4.091.18 0.61 - 1.24 mg/dL   Calcium 9.3 8.9 - 81.110.3 mg/dL   GFR calc non Af Amer >60 >60 mL/min   GFR calc Af Amer >60 >60 mL/min   Anion gap 8 5 - 15  CBC   Result Value Ref Range   WBC 8.6 4.0 - 10.5 K/uL   RBC 4.78 4.22 - 5.81 MIL/uL   Hemoglobin 14.7 13.0 - 17.0 g/dL   HCT 91.445.3 78.239.0 - 95.652.0 %   MCV 94.8 80.0 - 100.0 fL   MCH 30.8 26.0 - 34.0 pg   MCHC 32.5 30.0 - 36.0 g/dL   RDW 21.313.2 08.611.5 - 57.815.5 %   Platelets 198 150 - 400 K/uL   nRBC 0.0 0.0 - 0.2 %  I-stat troponin, ED  Result Value Ref Range   Troponin i, poc 0.00 0.00 - 0.08 ng/mL   Comment 3          I-stat troponin, ED  Result Value Ref Range   Troponin i, poc 0.00 0.00 - 0.08 ng/mL   Comment 3           Dg Chest 2 View  Result Date: 10/18/2018 CLINICAL DATA:  Central chest pressure for a week. EXAM: CHEST - 2 VIEW COMPARISON:  April 09, 2014 FINDINGS: The heart size and mediastinal contours are within normal limits. Both lungs are clear. The visualized skeletal structures are unremarkable. IMPRESSION: No active cardiopulmonary disease. Electronically Signed   By: Gerome Samavid  Williams III M.D   On: 10/18/2018 12:41     EKG EKG Interpretation  Date/Time:  Tuesday October 18 2018 12:05:52 EST Ventricular Rate:  85 PR Interval:  168 QRS Duration: 76 QT Interval:  358 QTC Calculation: 426 R Axis:   61 Text Interpretation:  Normal sinus rhythm Nonspecific T wave abnormality Confirmed by Cathren LaineSteinl, Lariya Kinzie ([id number]) on 10/18/2018 4:37:56 PM  Radiology Dg Chest 2 View  Result Date: 10/18/2018 CLINICAL DATA:  Central chest pressure for a week. EXAM: CHEST - 2 VIEW COMPARISON:  April 09, 2014 FINDINGS: The heart size and mediastinal contours are within normal limits. Both lungs are clear. The visualized skeletal structures are unremarkable. IMPRESSION: No active cardiopulmonary disease. Electronically Signed   By: Gerome Sam III M.D   On: 10/18/2018 12:41    Procedures Procedures (including critical care time)  Medications Ordered in ED Medications  sodium chloride flush (NS) 0.9 % injection 3 mL (has no administration in time range)  ibuprofen (ADVIL,MOTRIN) tablet 600 mg  (has no administration in time range)  acetaminophen (TYLENOL) tablet 1,000 mg (has no administration in time range)     Initial Impression / Assessment and Plan / ED Course  I have reviewed the triage vital signs and the nursing notes.  Pertinent labs & imaging results that were available during my care of the patient were reviewed by me and considered in my medical decision making (see chart for details).  Iv ns. Continuous pulse ox and monitor. Labs sent. Cxr. Ecg.  Reviewed nursing notes and prior charts for additional history. Patient w stent 2010. In 2015 underwent cath for recurrent chest pain - no obstructive cad/patent stent noted then.   Pt symptoms present for past week.   Labs reviewed - initial trop normal.  Chest xray reviewed - no pna.   ecg without acute st/t changes.   Will get delta trop.  Acetaminophen and ibuprofen given for symptom relief.   Delta trop Is normal.   After symptoms present for 1 week, constant for past day, initial and repeat trop 0 - not felt c/w acs.   Pt currently comfortable, no distress, no increased wob, vitals normal.  Pt indicates his cardiologist currently out of office, and request referral for f/u. Will provide. rec close outpt cardiology f/u.   Return precautions also provided.   Final Clinical Impressions(s) / ED Diagnoses   Final diagnoses:  None    ED Discharge Orders    None           Cathren Laine, MD 11/10/18 1444

## 2018-10-18 NOTE — Discharge Instructions (Addendum)
It was our pleasure to provide your ER care today - we hope that you feel better.  Your initial and repeat troponin levels are normal/good.   Follow up with cardiologist in the coming week - call office to arrange appointment.   Return to ER if worse, new symptoms, increased trouble breathing, recurrent or persistent chest pain, high fevers, new/severe pain, other concern.

## 2018-10-18 NOTE — ED Triage Notes (Signed)
Pt presents for evaluation of central chest pressure x 1 week. Reports has had similar pain before when he had MI. Pt states pain radiates to throat.

## 2018-10-26 DIAGNOSIS — G4733 Obstructive sleep apnea (adult) (pediatric): Secondary | ICD-10-CM | POA: Diagnosis not present

## 2018-10-26 DIAGNOSIS — I10 Essential (primary) hypertension: Secondary | ICD-10-CM | POA: Diagnosis not present

## 2018-11-25 DIAGNOSIS — G4733 Obstructive sleep apnea (adult) (pediatric): Secondary | ICD-10-CM | POA: Diagnosis not present

## 2018-11-25 DIAGNOSIS — I10 Essential (primary) hypertension: Secondary | ICD-10-CM | POA: Diagnosis not present

## 2018-12-14 DIAGNOSIS — Z125 Encounter for screening for malignant neoplasm of prostate: Secondary | ICD-10-CM | POA: Diagnosis not present

## 2018-12-14 DIAGNOSIS — E7800 Pure hypercholesterolemia, unspecified: Secondary | ICD-10-CM | POA: Diagnosis not present

## 2018-12-14 DIAGNOSIS — I10 Essential (primary) hypertension: Secondary | ICD-10-CM | POA: Diagnosis not present

## 2018-12-14 DIAGNOSIS — E1159 Type 2 diabetes mellitus with other circulatory complications: Secondary | ICD-10-CM | POA: Diagnosis not present

## 2018-12-14 DIAGNOSIS — Z0000 Encounter for general adult medical examination without abnormal findings: Secondary | ICD-10-CM | POA: Diagnosis not present

## 2018-12-26 DIAGNOSIS — I10 Essential (primary) hypertension: Secondary | ICD-10-CM | POA: Diagnosis not present

## 2018-12-26 DIAGNOSIS — G4733 Obstructive sleep apnea (adult) (pediatric): Secondary | ICD-10-CM | POA: Diagnosis not present

## 2019-01-25 DIAGNOSIS — I10 Essential (primary) hypertension: Secondary | ICD-10-CM | POA: Diagnosis not present

## 2019-01-25 DIAGNOSIS — G4733 Obstructive sleep apnea (adult) (pediatric): Secondary | ICD-10-CM | POA: Diagnosis not present

## 2020-02-24 IMAGING — CR DG CHEST 2V
2 series · 2 of 2 positions shown · non-contrast
Comparison: April 09, 2014

CLINICAL DATA: Central chest pressure for a week.

EXAM:
CHEST - 2 VIEW

[chest pa]
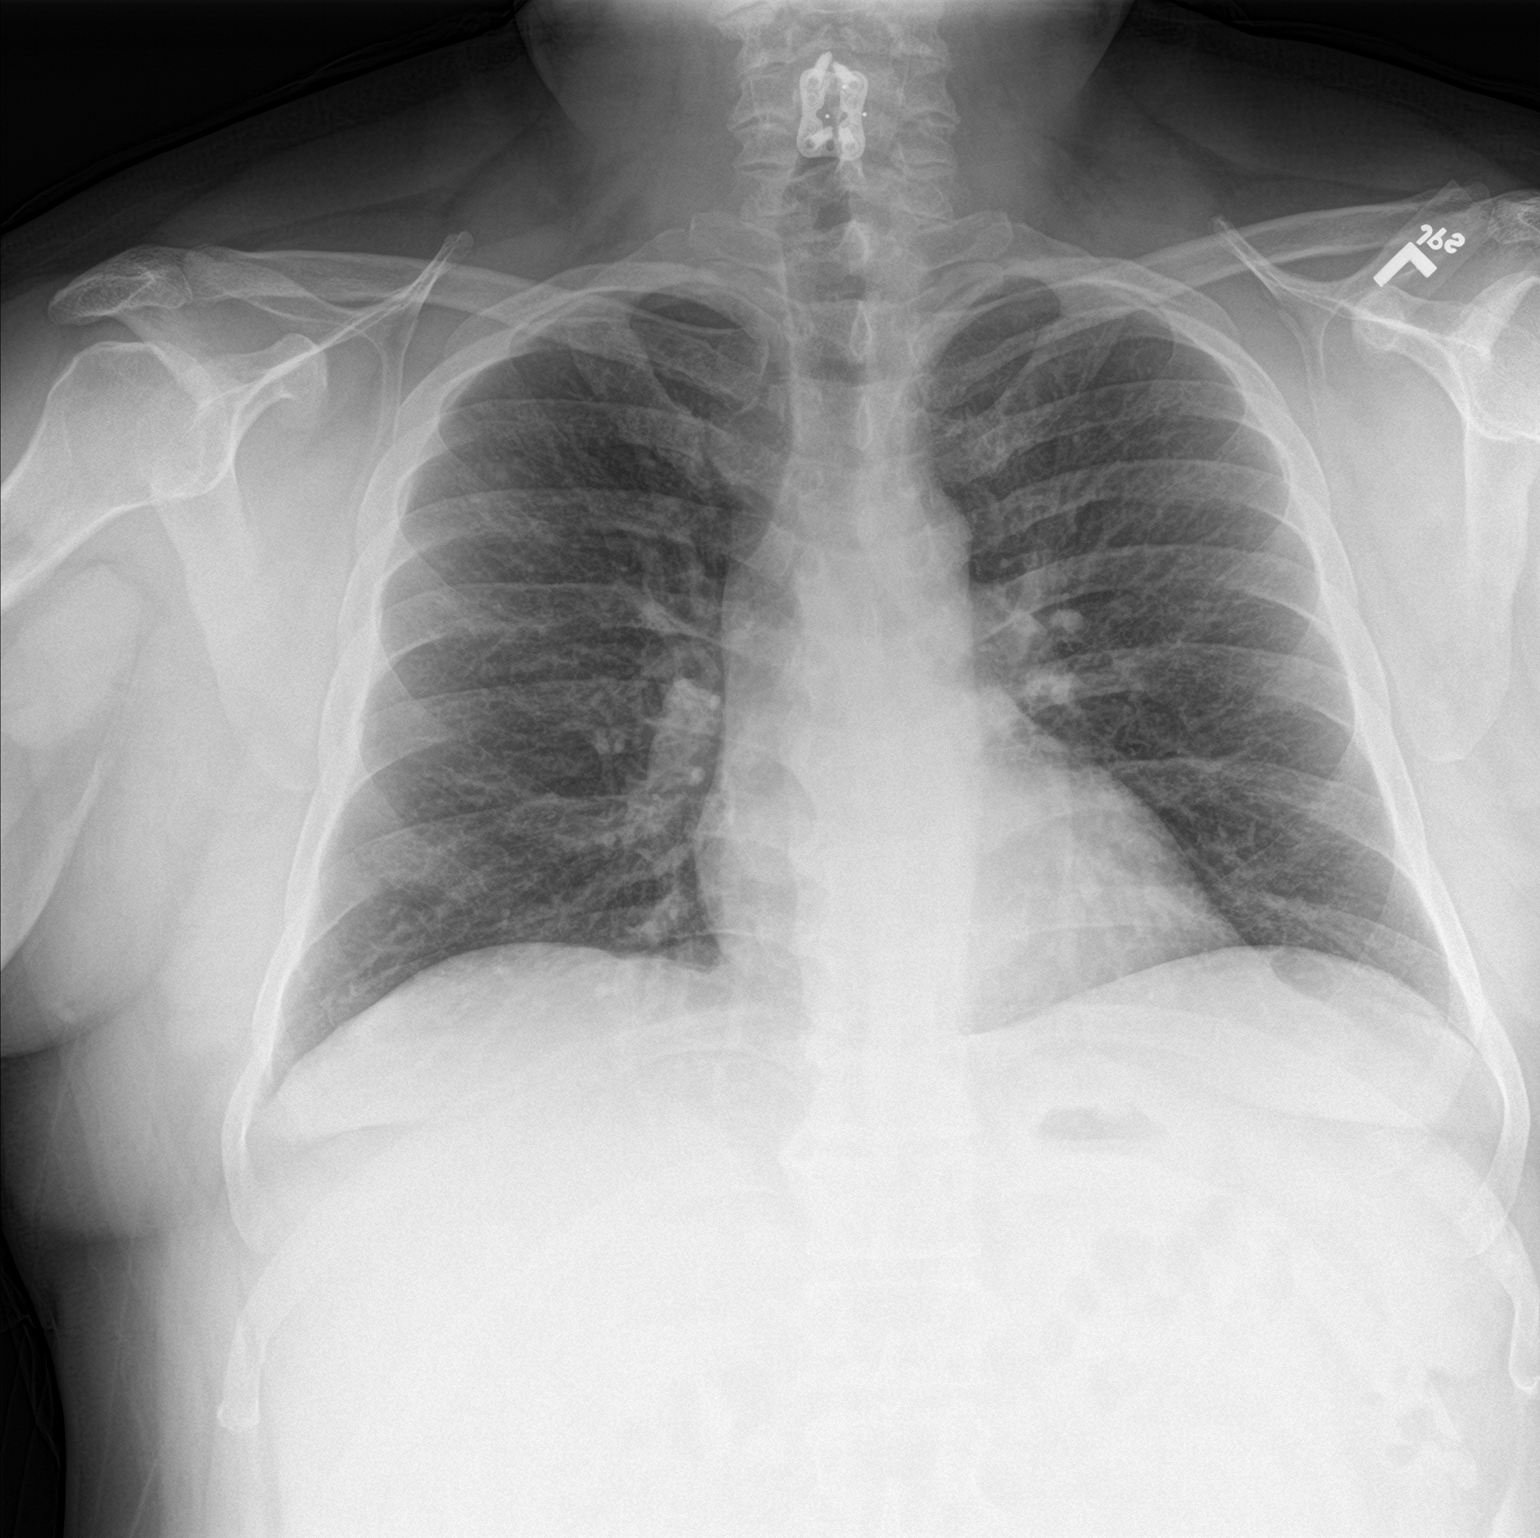

[chest lat]
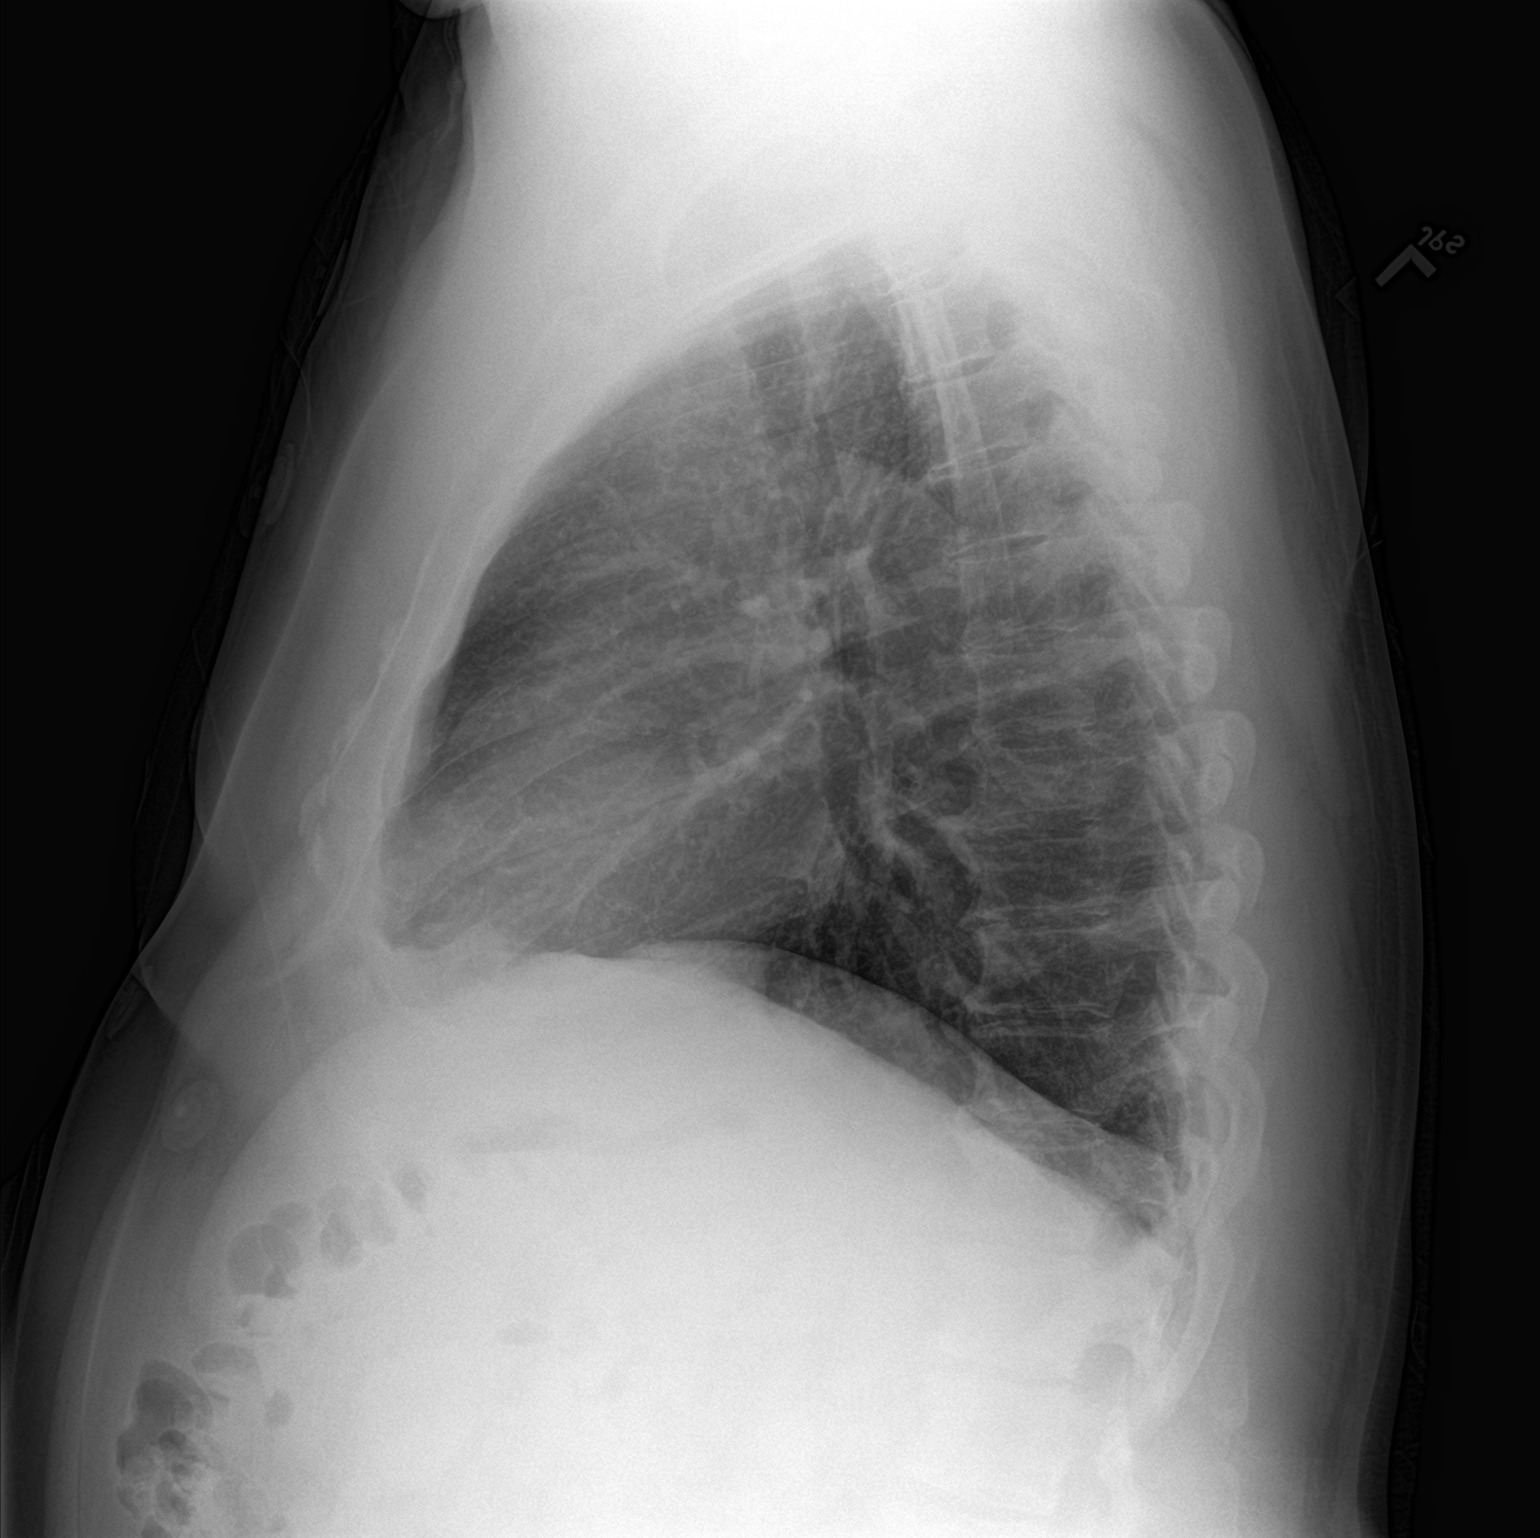

[2 of 2 positions shown; findings below may reference images not displayed]

FINDINGS: The heart size and mediastinal contours are within normal limits.
Both lungs are clear. The visualized skeletal structures are
unremarkable.
IMPRESSION: No active cardiopulmonary disease.
